# Patient Record
Sex: Male | Born: 1995 | Race: White | Hispanic: No
Health system: Southern US, Community
[De-identification: ages and names within clinical notes are randomized; demographics above are authoritative.]

## PROBLEM LIST (undated history)

## (undated) DIAGNOSIS — R319 Hematuria, unspecified: Secondary | ICD-10-CM

## (undated) DIAGNOSIS — C801 Malignant (primary) neoplasm, unspecified: Secondary | ICD-10-CM

---
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
## 2013-05-13 ENCOUNTER — Encounter: Payer: Self-pay | Admitting: Family Medicine

## 2013-05-14 ENCOUNTER — Emergency Department (HOSPITAL_COMMUNITY)
Admission: EM | Admit: 2013-05-14 | Discharge: 2013-05-14 | Disposition: A | Discharge status: Home / Self Care | Payer: Medicaid Other | Attending: Emergency Medicine | Admitting: Emergency Medicine

## 2013-05-14 ENCOUNTER — Encounter (HOSPITAL_COMMUNITY): Payer: Self-pay | Admitting: Emergency Medicine

## 2013-05-14 DIAGNOSIS — X30XXXA Exposure to excessive natural heat, initial encounter: Secondary | ICD-10-CM | POA: Insufficient documentation

## 2013-05-14 DIAGNOSIS — Y9289 Other specified places as the place of occurrence of the external cause: Secondary | ICD-10-CM | POA: Insufficient documentation

## 2013-05-14 DIAGNOSIS — T678XXA Other effects of heat and light, initial encounter: Secondary | ICD-10-CM | POA: Insufficient documentation

## 2013-05-14 DIAGNOSIS — Z79899 Other long term (current) drug therapy: Secondary | ICD-10-CM | POA: Insufficient documentation

## 2013-05-14 DIAGNOSIS — F17200 Nicotine dependence, unspecified, uncomplicated: Secondary | ICD-10-CM | POA: Insufficient documentation

## 2013-05-14 DIAGNOSIS — Y9389 Activity, other specified: Secondary | ICD-10-CM | POA: Insufficient documentation

## 2013-05-14 DIAGNOSIS — T679XXA Effect of heat and light, unspecified, initial encounter: Secondary | ICD-10-CM

## 2013-05-14 LAB — URINALYSIS, ROUTINE W REFLEX MICROSCOPIC
Bilirubin Urine: NEGATIVE
GLUCOSE, UA: NEGATIVE mg/dL
Hgb urine dipstick: NEGATIVE
KETONES UR: NEGATIVE mg/dL
LEUKOCYTES UA: NEGATIVE
NITRITE: NEGATIVE
PROTEIN: NEGATIVE mg/dL
Specific Gravity, Urine: 1.025 (ref 1.005–1.030)
UROBILINOGEN UA: 1 mg/dL (ref 0.0–1.0)
pH: 6 (ref 5.0–8.0)

## 2013-05-14 LAB — I-STAT CHEM 8, ED
BUN: 12 mg/dL (ref 6–23)
CALCIUM ION: 1.2 mmol/L (ref 1.12–1.23)
Chloride: 102 mEq/L (ref 96–112)
Creatinine, Ser: 1.1 mg/dL (ref 0.50–1.35)
Glucose, Bld: 68 mg/dL — ABNORMAL LOW (ref 70–99)
HEMATOCRIT: 46 % (ref 39.0–52.0)
HEMOGLOBIN: 15.6 g/dL (ref 13.0–17.0)
Potassium: 3.6 mEq/L — ABNORMAL LOW (ref 3.7–5.3)
SODIUM: 142 meq/L (ref 137–147)
TCO2: 27 mmol/L (ref 0–100)

## 2013-05-14 MED ORDER — POTASSIUM CHLORIDE 20 MEQ/15ML (10%) PO LIQD
40.0000 meq | Freq: Once | ORAL | Status: DC
  Filled 2013-05-14: qty 30

## 2013-05-14 MED ORDER — POTASSIUM CHLORIDE CRYS ER 20 MEQ PO TBCR
40.0000 meq | EXTENDED_RELEASE_TABLET | Freq: Once | ORAL | Status: AC
  Administered 2013-05-14: 40 meq via ORAL
  Filled 2013-05-14: qty 2

## 2013-05-14 NOTE — ED Notes (Addendum)
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

## 2013-05-14 NOTE — Discharge Instructions (Signed)
°Emergency Department Resource Guide °1) Find a Doctor and Pay Out of Pocket °Although you won't have to find out who is covered by your insurance plan, it is a good idea to ask around and get recommendations. You will then need to call the office and see if the doctor you have chosen will accept you as a new patient and what types of options they offer for patients who are self-pay. Some doctors offer discounts or will set up payment plans for their patients who do not have insurance, but you will need to ask so you aren't surprised when you get to your appointment. ° °2) Contact Your Local Health Department °Not all health departments have doctors that can see patients for sick visits, but many do, so it is worth a call to see if yours does. If you don't know where your local health department is, you can check in your phone book. The CDC also has a tool to help you locate your state's health department, and many state websites also have listings of all of their local health departments. ° °3) Find a Walk-in Clinic °If your illness is not likely to be very severe or complicated, you may want to try a walk in clinic. These are popping up all over the country in pharmacies, drugstores, and shopping centers. They're usually staffed by nurse practitioners or physician assistants that have been trained to treat common illnesses and complaints. They're usually fairly quick and inexpensive. However, if you have serious medical issues or chronic medical problems, these are probably not your best option. ° °No Primary Care Doctor: °- Call [facility] at  832-8000 - they can help you locate a primary care doctor that  accepts your insurance, provides certain services, etc. °- Physician Referral Service- 1-800-533-3463 ° °Chronic Pain Problems: °Organization         Address  Phone   Notes  °[facility]  (336) 297-2271 Patients need to be referred by their primary care doctor.  ° °Medication  Assistance: °Organization         Address  Phone   Notes  °[place] [facility] [address] °[place], [place] [address] (336) 641-8030 --Must be a resident of [place] °-- Must have NO insurance coverage whatsoever (no Medicaid/ Medicare, etc.) °-- The pt. MUST have a primary care doctor that directs their care regularly and follows them in the community °  °[facility]  (866) 331-1348   °[employer]  (888) 892-1162   ° °Agencies that provide inexpensive medical care: °Organization         Address  Phone   Notes  °[facility]  (336) 832-8035   °[facility]    (336) 832-7272   °[facility] [address] °[place], [place] [address] (336) 832-4777   °[facility] [address], °[place] (336) 271-4999   °[facility]    (336) 373-0678   °[facility]    (336) 272-1050   °[facility] ° [address], [place] Phone:  (336) 832-4444, Fax:  (336) 832-4440 Hours of Operation:  9 am - 6 pm, M-F.  Also accepts Medicaid/Medicare and self-pay.  °[facility] ° [address], [place] Phone: (336) 832-3150, Fax: (336) [phone]. Hours of Operation:  8:30 am - 5:30 pm, M-F.  Also accepts Medicaid and self-pay.  °[facility] [place] [address], [place] Phone: (336) 878-6027   °[facility] [address], [place], [place] (336)723-1848, Ext. 123 Mondays & Thursdays: 7-9 AM.  First 15 patients are seen on a first come, first serve basis. °  ° °Medicaid-accepting [place] Providers: ° °Organization         Address  Phone   Notes  °[facility] [address], [place] (336) 641-2100 Also accepts self-pay patients.  °[facility] [address], [place] ° (336) 856-9996   °[facility] [address], [place]  (336) 288-8857   °[facility] [address] [place] [address], [place] (336) 299-7000   °Veita Bland [address], [place]  ° (336) 373-1557 Only accepts [place] Access Medicaid patients after they have their name applied to their card.  ° °Self-Pay (no insurance) in [place]: ° °Organization         Address  Phone   Notes  °Sickle Cell Patients, [facility] [address], [place] (336) 832-1970   °[facility] [address], [place] (336) 832-4400   °[facility] ° [address], [place] (336) 992-4800   °[facility]/Dr. Osei-Bonsu ° [address] [place] [address], [place] or [address], [place] (336) 841-8500 Phone number for both [place] and [place] locations is the same.  °[facility] [address], [place] (336) 299-0000   °[facility] [address] [place] [address], [place] or [address] (336) 852-7530 °(336) 878-2260   °[facility] [address], [place] (336) 350-1642, phone; (336) 294-5005, fax Sees patients 1st and 3rd Saturday of every month.  Must not qualify for public or private insurance (i.e. Medicaid, Medicare, [place] Health Choice, Veterans' Benefits) • Household income should be no more than 200% of the poverty level •The clinic cannot treat you if you are pregnant or think you are pregnant • Sexually transmitted diseases are not treated at the clinic.  ° ° °Dental Care: °Organization         Address  Phone  Notes  °[place] [facility] [address], [place] (336) 641-6152 Accepts children up to age 21 who are enrolled in Medicaid or [place] Health Choice; pregnant women with a Medicaid card; and children who have applied for Medicaid or [place] Health Choice, but were declined, whose parents can pay a reduced fee at time of service.  °[place]  [facility] [place]  [address], [place] (336) 641-7733 Accepts children up to age 21 who are enrolled in Medicaid or [place] Health Choice; pregnant women with a Medicaid card; and children who have applied for Medicaid or  Health Choice, but were declined, whose parents can pay a reduced fee at time of service.  °[facility] ° [address], [place] (336) 641-4533 Patients are seen by appointment only. Walk-ins are not accepted. [facility] will see patients 18 years of age and older. °Monday - Tuesday (8am-5pm) °Most Wednesdays (8:30-5pm) °$30 per visit, cash only  °[facility] ° [address], [place] (336) 641-4533 Patients are seen by appointment only. Walk-ins are not accepted. [facility] will see patients 18 years of age and older. °One   Wednesday Evening (Monthly: Volunteer Based).  $30 per visit, cash only  °[facility]  (919) [phone] for adults; Children under age 4, call Graduate Pediatric Dentistry at ([phone]) [phone]. Children aged 4-14, please call ([phone]) [phone] to request a pediatric application. ° Dental services are provided in all areas of dental care including fillings, crowns and bridges, complete and partial dentures, implants, gum treatment, root canals, and extractions. Preventive care is also provided. Treatment is provided to both adults and children. °Patients are selected via a lottery and there is often a waiting list. °  °[facility] [address], °[place] ° (336) 763-8833 [email or web address] °  °[facility] [address], [place], [place] (336)723-1848, Ext. 123 Second and Fourth Thursday of each month, opens at 6:30 AM; Clinic ends at 9 AM.  Patients are seen on a first-come first-served basis, and a limited number are seen during each clinic.  ° °[facility] ° [address], [place], [place] (336) 723-7904    Eligibility Requirements °You must have lived in [place], [place], or [place] counties for at least the last three months. °  You cannot be eligible for state or federal sponsored healthcare insurance, including Veterans Administration, Medicaid, or Medicare. °  You generally cannot be eligible for healthcare insurance through your employer.  °  How to apply: °Eligibility screenings are held every Tuesday and Wednesday afternoon from 1:00 pm until 4:00 pm. You do not need an appointment for the interview!  °[facility] [address], [place], [place] 336-631-2330   °[facility]  336-342-8273   °[facility]  336-703-3100   °[facility]  336-570-6415   ° °Behavioral Health Resources in the Community: °Intensive Outpatient Programs °Organization         Address  Phone  Notes  °[facility] [address], [place], [place] 336-878-6098   °[facility] [address], [place], [place] 336-832-9800   °[facility] [address], [place], [place] ° 336-882-2125   °[facility] [address],  °[place], [place] 1-800-853-5163 or 336-641-4981   °Substance Abuse Resources °Organization         Address  Phone  Notes  °[facility]  336-882-2125   °[facility]  336-784-9470   °[facility]  336-285-9073   °[facility]  336-845-3988   °Residential & Outpatient Substance Abuse Program  1-800-659-3381   °Psychological Services °Organization         Address  Phone  Notes  °[facility]  336- 832-9600   °[facility]  336- 378-7881   °[facility] [address], [place] 1-800-853-5163 or 336-641-4981   ° °Mobile Crisis Teams °Organization         Address  Phone  Notes  °[facility], Mobile Crisis Care Unit  1-877-626-1772   °Assertive °[facility] ° [address]  [place], [place] 336-834-9664   °Sharon DeEsch [address] °[place] 336-554-5454   ° °Self-Help/Support Groups °Organization         Address  Phone             Notes  °[facility]  - variety of support groups  336- 373-1402 Call for more information  °Narcotics Anonymous (NA), [facility] [address], °[place]  2 meetings at this location  ° °  Residential Treatment Programs Organization         Address  Phone  Notes  [facility] [address],    [place]  1-908-775-1287   [facility]  [address] [place] [address], [place], [place]   [place], [place] [phone] Admissions: 8am-3pm M-F  [facility] [place] [address],    [place], [place] [phone]   [facility] [address] [place], [place], [place]   [facility] [address],  [place], [place]   [facility] Intensive Outpatient [address], [place], [place]   [facility] ([place] [address]  [place], [place] 1-(203)603-1468 or 929-552-3387   [facility] [address] [place], [place] Accepts Medicaid  [facility] [address]  [place] 1-(917)487-2581 Substance Abuse/Addiction Treatment   [facility]         Address  Phone  Notes  [facility]  763-131-1028   Domenic Schwab, PhD [address] Arlis Porta [place], [place]   (815) 207-0894 or 276-864-3554   [facility] [place], [place] [phone]   [facility] [address] [place], [place] (469)633-0539 Insurance/Medicaid/sponsorship through [facility] [address] [place]                                    [place], [place] [phone] [place] [address], [place] [phone]    Dr. Adele Schilder  ([phone]   [facility] Dept. 1) [address], [place] 2) [place] 3)  [address], [place] [phone]  [phone]   [place] (716)[phone] or [phone] (After Hours)      Increase your fluid intake (ie:  Gatorade) for the next few days, as discussed. Keep yourself hydrated when you work outside in the heat with Gatorade (not caffeinated products).  Return to the Emergency Department immediately if not improving (or even worsening) despite taking the medicines as prescribed, any black or bloody stool or vomit, if you develop a fever over "101," or for any other concerns.

## 2013-05-14 NOTE — ED Provider Notes (Signed)
CSN: [id number]     Arrival date & time 05/14/13  5638 History   First MD Initiated Contact with Patient 05/14/13 [phone]     Chief Complaint  Patient presents with  . Dizziness      HPI Pt was seen at 1010. Per pt, c/o gradual onset and resolution of one episode of feeling "lightheaded" that occurred yesterday after working outside all day in the hot sun. Lightheadedness was associated with an episode of N/V. Pt states he "was just drinking [employer]" yesterday while working outside. States after he got home last night he has been drinking Gatorade with improvement in his symptoms today. Pt states he came to the ED "just to get checked out" "because I called into work today." Has tol PO well without N/V. Denies CP/palpitations, no SOB/cough, no abd pain, no diarrhea, no back pain, no fevers, no rash, no focal motor weakness, no tingling/numbness in extremities, no syncope, no black or blood in stools or emesis.    History reviewed. No pertinent past medical history.  Past Surgical History  Procedure Laterality Date  . Appendectomy  2006    History  Substance Use Topics  . Smoking status: Current Every Day Smoker  . Smokeless tobacco: Not on file  . Alcohol Use: No    Review of Systems ROS: Statement: All systems negative except as marked or noted in the HPI; Constitutional: Negative for fever and chills. ; ; Eyes: Negative for eye pain, redness and discharge. ; ; ENMT: Negative for ear pain, hoarseness, nasal congestion, sinus pressure and sore throat. ; ; Cardiovascular: Negative for chest pain, palpitations, diaphoresis, dyspnea and peripheral edema. ; ; Respiratory: Negative for cough, wheezing and stridor. ; ; Gastrointestinal: +N/V. Negative for diarrhea, abdominal pain, blood in stool, hematemesis, jaundice and rectal bleeding. . ; ; Genitourinary: Negative for dysuria, flank pain and hematuria. ; ; Musculoskeletal: Negative for back pain and neck pain. Negative for swelling and  trauma.; ; Skin: Negative for pruritus, rash, abrasions, blisters, bruising and skin lesion.; ; Neuro: +lightheadedness. Negative for headache and neck stiffness. Negative for weakness, altered level of consciousness , altered mental status, extremity weakness, paresthesias, involuntary movement, seizure and syncope.       Allergies  Review of patient's allergies indicates not on file.  Home Medications   Prior to Admission medications   Medication Sig Start Date End Date Taking? Authorizing Provider  phenazopyridine (PYRIDIUM) 200 MG tablet Take 1 tablet (200 mg total) by mouth 3 (three) times daily as needed for pain. 07/18/12   Evalee Jefferson, PA-C   BP 116/55  Pulse 68  Temp(Src) 98.2 F (36.8 C) (Oral)  Resp 18  Ht 5\' 7"  (1.702 m)  Wt 135 lb (61.236 kg)  BMI 21.14 kg/m2  SpO2 100% Physical Exam 1015: Physical examination:  Nursing notes reviewed; Vital signs and O2 SAT reviewed;  Constitutional: Well developed, Well nourished, Well hydrated, In no acute distress; Head:  Normocephalic, atraumatic; Eyes: EOMI, PERRL, No scleral icterus; ENMT: TM's clear bilat. +edemetous nasal turbinates bilat with clear rhinorrhea. Mouth and pharynx normal, Mucous membranes moist; Neck: Supple, Full range of motion, No lymphadenopathy; Cardiovascular: Regular rate and rhythm, No murmur, rub, or gallop; Respiratory: Breath sounds clear & equal bilaterally, No rales, rhonchi, wheezes.  Speaking full sentences with ease, Normal respiratory effort/excursion; Chest: Nontender, Movement normal; Abdomen: Soft, Nontender, Nondistended, Normal bowel sounds; Genitourinary: No CVA tenderness; Extremities: Pulses normal, No tenderness, No edema, No calf edema or asymmetry.; Neuro: AA&Ox3, Major CN  grossly intact.  Speech clear. No gross focal motor or sensory deficits in extremities. Climbs on and off stretcher easily by himself. Gait steady.; Skin: Color normal, Warm, Dry.   ED Course  Procedures     EKG  Interpretation None      MDM  MDM Reviewed: previous chart, nursing note and vitals Reviewed previous: labs Interpretation: labs    Results for orders placed during the hospital encounter of 05/14/13  URINALYSIS, ROUTINE W REFLEX MICROSCOPIC      Result Value Ref Range   Color, Urine YELLOW  YELLOW   APPearance CLEAR  CLEAR   Specific Gravity, Urine 1.025  1.005 - 1.030   pH 6.0  5.0 - 8.0   Glucose, UA NEGATIVE  NEGATIVE mg/dL   Hgb urine dipstick NEGATIVE  NEGATIVE   Bilirubin Urine NEGATIVE  NEGATIVE   Ketones, ur NEGATIVE  NEGATIVE mg/dL   Protein, ur NEGATIVE  NEGATIVE mg/dL   Urobilinogen, UA 1.0  0.0 - 1.0 mg/dL   Nitrite NEGATIVE  NEGATIVE   Leukocytes, UA NEGATIVE  NEGATIVE  I-STAT CHEM 8, ED      Result Value Ref Range   Sodium 142  137 - 147 mEq/L   Potassium 3.6 (*) 3.7 - 5.3 mEq/L   Chloride 102  96 - 112 mEq/L   BUN 12  6 - 23 mg/dL   Creatinine, Ser 1.10  0.50 - 1.35 mg/dL   Glucose, Bld 68 (*) 70 - 99 mg/dL   Calcium, Ion 1.20  1.12 - 1.23 mmol/L   TCO2 27  0 - 100 mmol/L   Hemoglobin 15.6  13.0 - 17.0 g/dL   HCT 46.0  39.0 - 52.0 %    1200:  Workup reassuring. Not orthostatic on VS. Potasium repleted PO. Pt has tol PO well without N/V while in the ED. States he continues to feel "better" and wants to go home now. Dx and testing d/w pt and family.  Questions answered.  Verb understanding, agreeable to d/c home with outpt f/u.     Alfonzo Feller, DO 05/16/13 1557

## 2013-05-15 LAB — URINE CULTURE
Colony Count: NO GROWTH
Culture: NO GROWTH

## 2015-02-11 ENCOUNTER — Emergency Department (HOSPITAL_COMMUNITY): Payer: Self-pay

## 2015-02-11 ENCOUNTER — Encounter (HOSPITAL_COMMUNITY): Payer: Self-pay | Admitting: Emergency Medicine

## 2015-02-11 ENCOUNTER — Emergency Department (HOSPITAL_COMMUNITY)
Admission: EM | Admit: 2015-02-11 | Discharge: 2015-02-11 | Disposition: A | Discharge status: Home / Self Care | Payer: Self-pay | Attending: Emergency Medicine | Admitting: Emergency Medicine

## 2015-02-11 DIAGNOSIS — R10A0 Flank pain, unspecified side: Secondary | ICD-10-CM

## 2015-02-11 DIAGNOSIS — R319 Hematuria, unspecified: Secondary | ICD-10-CM

## 2015-02-11 DIAGNOSIS — F17200 Nicotine dependence, unspecified, uncomplicated: Secondary | ICD-10-CM | POA: Insufficient documentation

## 2015-02-11 DIAGNOSIS — R109 Unspecified abdominal pain: Secondary | ICD-10-CM | POA: Insufficient documentation

## 2015-02-11 HISTORY — DX: Hematuria, unspecified: R31.9

## 2015-02-11 LAB — URINALYSIS, ROUTINE W REFLEX MICROSCOPIC
Glucose, UA: NEGATIVE mg/dL
LEUKOCYTES UA: NEGATIVE
NITRITE: NEGATIVE
PH: 6 (ref 5.0–8.0)
PROTEIN: 100 mg/dL — AB
Specific Gravity, Urine: 1.025 (ref 1.005–1.030)

## 2015-02-11 LAB — URINE MICROSCOPIC-ADD ON: SQUAMOUS EPITHELIAL / LPF: NONE SEEN

## 2015-02-11 LAB — BASIC METABOLIC PANEL
Anion gap: 8 (ref 5–15)
BUN: 16 mg/dL (ref 6–20)
CALCIUM: 9.9 mg/dL (ref 8.9–10.3)
CO2: 27 mmol/L (ref 22–32)
CREATININE: 1.21 mg/dL (ref 0.61–1.24)
Chloride: 106 mmol/L (ref 101–111)
GFR calc Af Amer: >60 mL/min (ref >60)
GLUCOSE: 91 mg/dL (ref 65–99)
POTASSIUM: 4.4 mmol/L (ref 3.5–5.1)
SODIUM: 141 mmol/L (ref 135–145)

## 2015-02-11 LAB — CBC
HCT: 46.4 % (ref 39.0–52.0)
Hemoglobin: 16.7 g/dL (ref 13.0–17.0)
MCH: 32.5 pg (ref 26.0–34.0)
MCHC: 36 g/dL (ref 30.0–36.0)
MCV: 90.3 fL (ref 78.0–100.0)
PLATELETS: 220 10*3/uL (ref 150–400)
RBC: 5.14 MIL/uL (ref 4.22–5.81)
RDW: 12.6 % (ref 11.5–15.5)
WBC: 7.5 10*3/uL (ref 4.0–10.5)

## 2015-02-11 NOTE — ED Notes (Signed)
MD at bedside. 

## 2015-02-11 NOTE — ED Provider Notes (Signed)
CSN: [id number]     Arrival date & time 02/11/15  1310 History   First MD Initiated Contact with Patient 02/11/15 1521     Chief Complaint  Patient presents with  . Hematuria      HPI Pt was seen at 1535. Per pt, c/o gradual onset and persistence of multiple intermittent episodes of hematuria for the past 3 years. Pt states he was evaluated in the ED several years ago for same, but never f/u with Uro MD. Pt states he has continued to have intermittent hematuria, as well as left flank "pain" since then. States he is "worried I might have prostate cancer," so he came to the ED for re-evaluation. Denies any change in his symptoms over the past 3 years. Denies dysuria, no testicular pain/swelling, no abd pain, no N/V/D, no fevers, no rash.    Past Medical History  Diagnosis Date  . Hematuria 2014   Past Surgical History  Procedure Laterality Date  . Appendectomy  2006    Social History  Substance Use Topics  . Smoking status: Current Every Day Smoker  . Smokeless tobacco: None  . Alcohol Use: No    Review of Systems ROS: Statement: All systems negative except as marked or noted in the HPI; Constitutional: Negative for fever and chills. ; ; Eyes: Negative for eye pain, redness and discharge. ; ; ENMT: Negative for ear pain, hoarseness, nasal congestion, sinus pressure and sore throat. ; ; Cardiovascular: Negative for chest pain, palpitations, diaphoresis, dyspnea and peripheral edema. ; ; Respiratory: Negative for cough, wheezing and stridor. ; ; Gastrointestinal: Negative for nausea, vomiting, diarrhea, abdominal pain, blood in stool, hematemesis, jaundice and rectal bleeding. . ; ; Genitourinary: Negative for dysuria. +flank pain and hematuria. ; ; Genital:  No penile drainage or rash, no testicular pain or swelling, no scrotal rash or swelling. ;; Musculoskeletal: Negative for back pain and neck pain. Negative for swelling and trauma.; ; Skin: Negative for pruritus, rash, abrasions,  blisters, bruising and skin lesion.; ; Neuro: Negative for headache, lightheadedness and neck stiffness. Negative for weakness, altered level of consciousness , altered mental status, extremity weakness, paresthesias, involuntary movement, seizure and syncope.      Allergies  Review of patient's allergies indicates no known allergies.  Home Medications   Prior to Admission medications   Not on File   BP 125/78 mmHg  Pulse 77  Temp(Src) 98.4 F (36.9 C) (Oral)  Resp 16  Ht 5\' 7"  (1.702 m)  Wt 130 lb (58.968 kg)  BMI 20.36 kg/m2  SpO2 91% Physical Exam  1540: Physical examination:  Nursing notes reviewed; Vital signs and O2 SAT reviewed;  Constitutional: Well developed, Well nourished, Well hydrated, In no acute distress; Head:  Normocephalic, atraumatic; Eyes: EOMI, PERRL, No scleral icterus; ENMT: Mouth and pharynx normal, Mucous membranes moist; Neck: Supple, Full range of motion, No lymphadenopathy; Cardiovascular: Regular rate and rhythm, No murmur, rub, or gallop; Respiratory: Breath sounds clear & equal bilaterally, No rales, rhonchi, wheezes.  Speaking full sentences with ease, Normal respiratory effort/excursion; Chest: Nontender, Movement normal; Abdomen: Soft, Nontender, Nondistended, Normal bowel sounds; Genitourinary: No CVA tenderness; Extremities: Pulses normal, No tenderness, No edema, No calf edema or asymmetry.; Neuro: AA&Ox3, Major CN grossly intact.  Speech clear. No gross focal motor or sensory deficits in extremities.; Skin: Color normal, Warm, Dry.   ED Course  Procedures (including critical care time) Labs Review  Imaging Review  I have personally reviewed and evaluated these images and lab results  as part of my medical decision-making.   EKG Interpretation None      MDM  MDM Reviewed: previous chart, nursing note and vitals Reviewed previous: CT scan Interpretation: labs and CT scan      Results for orders placed or performed during the hospital  encounter of 02/11/15  Urinalysis, Routine w reflex microscopic (not at [facility])  Result Value Ref Range   Color, Urine AMBER (A) YELLOW   APPearance CLOUDY (A) CLEAR   Specific Gravity, Urine 1.025 1.005 - 1.030   pH 6.0 5.0 - 8.0   Glucose, UA NEGATIVE NEGATIVE mg/dL   Hgb urine dipstick LARGE (A) NEGATIVE   Bilirubin Urine SMALL (A) NEGATIVE   Ketones, ur TRACE (A) NEGATIVE mg/dL   Protein, ur 100 (A) NEGATIVE mg/dL   Nitrite NEGATIVE NEGATIVE   Leukocytes, UA NEGATIVE NEGATIVE  Urine microscopic-add on  Result Value Ref Range   Squamous Epithelial / LPF NONE SEEN NONE SEEN   WBC, UA 0-5 0 - 5 WBC/hpf   RBC / HPF TOO NUMEROUS TO COUNT 0 - 5 RBC/hpf   Bacteria, UA RARE (A) NONE SEEN  CBC  Result Value Ref Range   WBC 7.5 4.0 - 10.5 K/uL   RBC 5.14 4.22 - 5.81 MIL/uL   Hemoglobin 16.7 13.0 - 17.0 g/dL   HCT 46.4 39.0 - 52.0 %   MCV 90.3 78.0 - 100.0 fL   MCH 32.5 26.0 - 34.0 pg   MCHC 36.0 30.0 - 36.0 g/dL   RDW 12.6 11.5 - 15.5 %   Platelets 220 150 - 400 K/uL  Basic metabolic panel  Result Value Ref Range   Sodium 141 135 - 145 mmol/L   Potassium 4.4 3.5 - 5.1 mmol/L   Chloride 106 101 - 111 mmol/L   CO2 27 22 - 32 mmol/L   Glucose, Bld 91 65 - 99 mg/dL   BUN 16 6 - 20 mg/dL   Creatinine, Ser 1.21 0.61 - 1.24 mg/dL   Calcium 9.9 8.9 - 10.3 mg/dL   GFR calc non Af Amer >60 >60 mL/min   GFR calc Af Amer >60 >60 mL/min   Anion gap 8 5 - 15   Ct Renal Stone Study 02/11/2015  CLINICAL DATA:  Flank pain with hematuria left-sided pain EXAM: CT ABDOMEN AND PELVIS WITHOUT CONTRAST TECHNIQUE: Multidetector CT imaging of the abdomen and pelvis was performed following the standard protocol without IV contrast. COMPARISON:  CT 07/18/2012 FINDINGS: Lower chest:  Lung bases clear without infiltrate or effusion. Hepatobiliary: Liver normal in size and contour without focal lesion. Gallbladder and bile ducts normal. Pancreas: Negative Spleen: Negative Adrenals/Urinary Tract: No renal  obstruction or mass. No urinary tract calculi. Vague density in the right bladder may represent blood clot versus tumor. This measures approximately 2.5 cm in diameter. Stomach/Bowel: Negative for bowel obstruction. No bowel edema. Appendectomy clips right lower quadrant. Vascular/Lymphatic: Normal aorta and IVC.  No lymphadenopathy. Reproductive: Normal prostate Other: No free fluid in the pelvis or abdomen. Musculoskeletal: No fracture or bone lesion identified. IMPRESSION: Negative for urinary tract calculi.  No renal obstruction Vague density in the right bladder may represent blood clot versus tumor. The patient is currently give history of hematuria. Electronically Signed   By: Franchot Gallo M.D.   On: 02/11/2015 16:59    1725:  T/C to Uro Dr. Matilde Sprang, case discussed, including:  HPI, pertinent PM/SHx, VS/PE, dx testing, ED course and treatment:  Agrees with ED workup, requests to encourage pt to  f/u in ofc this week, will need cystoscope to further evaluate CT findings. Dx and Youlanda Roys well as d/w Uro MD, d/w pt.  Questions answered.  Verb understanding, agreeable to d/c home with outpt f/u.   Francine Graven, DO 02/15/15 1712

## 2015-02-11 NOTE — Discharge Instructions (Signed)
°Emergency Department Resource Guide °1) Find a Doctor and Pay Out of Pocket °Although you won't have to find out who is covered by your insurance plan, it is a good idea to ask around and get recommendations. You will then need to call the office and see if the doctor you have chosen will accept you as a new patient and what types of options they offer for patients who are self-pay. Some doctors offer discounts or will set up payment plans for their patients who do not have insurance, but you will need to ask so you aren't surprised when you get to your appointment. ° °2) Contact Your Local Health Department °Not all health departments have doctors that can see patients for sick visits, but many do, so it is worth a call to see if yours does. If you don't know where your local health department is, you can check in your phone book. The CDC also has a tool to help you locate your state's health department, and many state websites also have listings of all of their local health departments. ° °3) Find a Walk-in Clinic °If your illness is not likely to be very severe or complicated, you may want to try a walk in clinic. These are popping up all over the country in pharmacies, drugstores, and shopping centers. They're usually staffed by nurse practitioners or physician assistants that have been trained to treat common illnesses and complaints. They're usually fairly quick and inexpensive. However, if you have serious medical issues or chronic medical problems, these are probably not your best option. ° °No Primary Care Doctor: °- Call [facility] Connect at  832-8000 - they can help you locate a primary care doctor that  accepts your insurance, provides certain services, etc. °- Physician Referral Service- 1-800-533-3463 ° °Chronic Pain Problems: °Organization         Address  Phone   Notes  °[facility]  ([phone]) [phone] Patients need to be referred by their primary care doctor.  ° °Medication  Assistance: °Organization         Address  Phone   Notes  °[place] [facility] [address] °[place], [place] [address] (336) 641-8030 --Must be a resident of [place] °-- Must have NO insurance coverage whatsoever (no Medicaid/ Medicare, etc.) °-- The pt. MUST have a primary care doctor that directs their care regularly and follows them in the community °  °[facility]  (866) 331-1348   °[employer]  (888) 892-1162   ° °Agencies that provide inexpensive medical care: °Organization         Address  Phone   Notes  °[facility]  (336) 832-8035   °[facility]    (336) 832-7272   °[facility] [address] °[place], [place] [address] (336) 832-4777   °[facility] [address], °[place] (336) 271-4999   °[facility]    (336) 373-0678   °[facility]    (336) 272-1050   °[facility] ° [address], [place] Phone:  (336) 832-4444, Fax:  (336) 832-4440 Hours of Operation:  9 am - 6 pm, M-F.  Also accepts Medicaid/Medicare and self-pay.  °[facility] ° [address],  Phone: (336) [phone], Fax: (336) [phone]. Hours of Operation:  8:30 am - 5:30 pm, M-F.  Also accepts Medicaid and self-pay.  °[facility] [place] [address], [place] Phone: (336) 878-6027   °[facility] [address], [place], [place] (336)723-1848, Ext. 123 Mondays & Thursdays: 7-9 AM.  First 15 patients are seen on a first come, first serve basis. °  ° °Medicaid-accepting [place] Providers: ° °Organization         Address  Phone   Notes  °[facility] [address], [place] (336) 641-2100 Also accepts self-pay patients.  °[facility] [address], [place] ° (336) 856-9996   °[facility] [address], [place]  (336) 288-8857   °[facility] [address] [place] [address], [place] (336) 299-7000   °Veita Bland [address], [place]  ° (336) [phone] Only accepts [place] Access Medicaid patients after they have their name applied to their card.  ° °Self-Pay (no insurance) in [place]: ° °Organization         Address  Phone   Notes  °Sickle Cell Patients, [facility] [address], [place] (336) 832-1970   °[facility] [address], [place] (336) 832-4400   °[facility] ° [address], [place] (336) 992-4800   °[facility]/Dr. Osei-Bonsu ° [address] [place] [address], [place] or [address], [place] (336) [phone] Phone number for both [place] and [place] locations is the same.  °[facility] [address], [place] (336) 299-0000   °[facility] [address] [place] [address], [place] or [address] (336) 852-7530 °(336) 878-2260   °[facility] [address], [place] (336) 350-1642, phone; (336) 294-5005, fax Sees patients 1st and 3rd Saturday of every month.  Must not qualify for public or private insurance (i.e. Medicaid, Medicare, [place] Health Choice, Veterans' Benefits) • Household income should be no more than 200% of the poverty level •The clinic cannot treat you if you are pregnant or think you are pregnant • Sexually transmitted diseases are not treated at the clinic.  ° ° °Dental Care: °Organization         Address  Phone  Notes  °[place] [facility] [address], [place] (336) 641-6152 Accepts children up to age 21 who are enrolled in Medicaid or [place] Health Choice; pregnant women with a Medicaid card; and children who have applied for Medicaid or [place] Health Choice, but were declined, whose parents can pay a reduced fee at time of service.  °[place]  [facility] [place]  [address], [place] (336) 641-7733 Accepts children up to age 21 who are enrolled in Medicaid or [place] Health Choice; pregnant women with a Medicaid card; and children who have applied for Medicaid or  Health Choice, but were declined, whose parents can pay a reduced fee at time of service.  °[facility] ° [address], [place] (336) 641-4533 Patients are seen by appointment only. Walk-ins are not accepted. [facility] will see patients 18 years of age and older. °Monday - Tuesday (8am-5pm) °Most Wednesdays (8:30-5pm) °$30 per visit, cash only  °[facility] ° [address], [place] (336) 641-4533 Patients are seen by appointment only. Walk-ins are not accepted. [facility] will see patients 18 years of age and older. °One   Wednesday Evening (Monthly: Volunteer Based).  $30 per visit, cash only  °[facility]  (919) 537-3737 for adults; Children under age 4, call Graduate Pediatric Dentistry at ([phone]) [phone]. Children aged 4-14, please call ([phone]) [phone] to request a pediatric application. ° Dental services are provided in all areas of dental care including fillings, crowns and bridges, complete and partial dentures, implants, gum treatment, root canals, and extractions. Preventive care is also provided. Treatment is provided to both adults and children. °Patients are selected via a lottery and there is often a waiting list. °  °[facility] [address], °[place] ° (336) 763-8833 [email or web address] °  °[facility] [address], [place], [place] (336)723-1848, Ext. 123 Second and Fourth Thursday of each month, opens at 6:30 AM; Clinic ends at 9 AM.  Patients are seen on a first-come first-served basis, and a limited number are seen during each clinic.  ° °[facility] ° [address], [place], [place] (336) [phone]    Eligibility Requirements °You must have lived in [place], [place], or [place] counties for at least the last three months. °  You cannot be eligible for state or federal sponsored healthcare insurance, including Veterans Administration, Medicaid, or Medicare. °  You generally cannot be eligible for healthcare insurance through your employer.  °  How to apply: °Eligibility screenings are held every Tuesday and Wednesday afternoon from 1:00 pm until 4:00 pm. You do not need an appointment for the interview!  °[facility] [address], [place], [place] 336-631-2330   °[facility]  336-342-8273   °[facility]  336-703-3100   °[facility]  336-570-6415   ° °Behavioral Health Resources in the Community: °Intensive Outpatient Programs °Organization         Address  Phone  Notes  °[facility] [address], [place], [place] 336-878-6098   °[facility] [address], [place], [place] 336-832-9800   °[facility] [address], [place], [place] ° 336-882-2125   °[facility] [address],  °[place], [place] 1-800-853-5163 or 336-641-4981   °Substance Abuse Resources °Organization         Address  Phone  Notes  °[facility]  336-882-2125   °[facility]  336-784-9470   °[facility]  336-285-9073   °[facility]  336-845-3988   °Residential & Outpatient Substance Abuse Program  1-800-659-3381   °Psychological Services °Organization         Address  Phone  Notes  °[facility]  336- 832-9600   °[facility]  336- 378-7881   °[facility] [address], [place] 1-800-853-5163 or 336-641-4981   ° °Mobile Crisis Teams °Organization         Address  Phone  Notes  °[facility], Mobile Crisis Care Unit  1-877-626-1772   °Assertive °[facility] ° [address]  [place], [place] 336-834-9664   °Sharon DeEsch [address] °[place] 336-554-5454   ° °Self-Help/Support Groups °Organization         Address  Phone             Notes  °[facility]  - variety of support groups  336- 373-1402 Call for more information  °Narcotics Anonymous (NA), [facility] [address], °[place]   2 meetings at this location  ° °  Residential Treatment Programs Organization         Address  Phone  Notes  [facility] [address]    [place]  1-(940)599-1231   [facility]  [address] [place] [id number], [place], [place]   [place], [place] [phone] Admissions: 8am-3pm M-F  [facility]stance [place] [address]    [place], [place] [phone]   [facility] [address] [place], [place], [place]   [facility] [address]  [place], [place]   [facility] Intensive Outpatient [address], [place], [place]   [facility] ([place] [place]  [place], [place] 1-6401210716 or 719-011-6308   [facility] [address], [place], [place] Accepts Medicaid  [facility] [place] [address],  [place] 1-570-089-9348 Substance Abuse/Addiction Treatment   [facility] Organization         Address  Phone  Notes  [facility]  (619)382-4676   Domenic Schwab, PhD [address] [place], [place]   867-335-2981 or [phone]   [place], [place] [phone]   [facility] [address] [place], [place] (512)295-4739 Insurance/Medicaid/sponsorship through [facility] [address] [place]                                    [place], [place] 231-715-1551 [place] [address] [place], [place] (831)[phone]    Dr. Adele Schilder  [phone]   [facility] Dept. 1) [address],  2) [place] 3)  [address], [place] [phone]  [phone]   [place] 475-595-5589 or [phone] (After Hours)      Your CT scan shows you may have a "blood clot" in your bladder. Call the Urologist on Monday to schedule a follow up appointment this week to further evaluate this finding.  Return to the Emergency Department immediately sooner if worsening.

## 2015-02-11 NOTE — ED Notes (Signed)
Having urinary problems for 2 - 3 years.  Having urinary frequency and blood in urine bright red.  Rates pain 6/10.

## 2015-02-11 NOTE — ED Notes (Signed)
Pt denies any n/v/d.

## 2015-02-13 LAB — URINE CULTURE

## 2015-02-21 ENCOUNTER — Ambulatory Visit (INDEPENDENT_AMBULATORY_CARE_PROVIDER_SITE_OTHER): Payer: Self-pay | Admitting: Urology

## 2015-02-21 DIAGNOSIS — C674 Malignant neoplasm of posterior wall of bladder: Secondary | ICD-10-CM

## 2015-02-22 ENCOUNTER — Telehealth: Payer: Self-pay | Admitting: Urology

## 2015-02-22 NOTE — Telephone Encounter (Signed)
Pt advised of pre op date/time and sx date. Spoke with Mardene Celeste Royann Shivers) Sx: 03/07/15 @ 12:45-1:45 @ [facility] with Dr. Diona Fanti with [facility]. Pre op: 03/03/15 @ 1:45am.  Self Pay.

## 2015-02-23 ENCOUNTER — Other Ambulatory Visit: Payer: Self-pay

## 2015-02-23 DIAGNOSIS — D494 Neoplasm of unspecified behavior of bladder: Secondary | ICD-10-CM

## 2015-03-02 NOTE — Patient Instructions (Signed)
David Stout  03/02/2015     @PREFPERIOPPHARMACY @   Your procedure is scheduled on 03/07/15  Report to [name] at 67 A.M.  Call this number if you have problems the morning of surgery:  [phone]   Remember:  Do not eat food or drink liquids after midnight.  Take these medicines the morning of surgery with A SIP OF WATER no meds   Do not wear jewelry, make-up or nail polish.  Do not wear lotions, powders, or perfumes.  You may wear deodorant.  Do not shave 48 hours prior to surgery.  Men may shave face and neck.  Do not bring valuables to the hospital.  [name] is not responsible for any belongings or valuables.  Contacts, dentures or bridgework may not be worn into surgery.  Leave your suitcase in the car.  After surgery it may be brought to your room.  For patients admitted to the hospital, discharge time will be determined by your treatment team.  Patients discharged the day of surgery will not be allowed to drive home.   Name and phone number of your driver:   family Special instructions:    Please read over the following fact sheets that you were given. Surgical Site Infection Prevention, Anesthesia Post-op Instructions and Care and Recovery After Surgery      PATIENT INSTRUCTIONS POST-ANESTHESIA  IMMEDIATELY FOLLOWING SURGERY:  Do not drive or operate machinery for the first twenty four hours after surgery.  Do not make any important decisions for twenty four hours after surgery or while taking narcotic pain medications or sedatives.  If you develop intractable nausea and vomiting or a severe headache please notify your doctor immediately.  FOLLOW-UP:  Please make an appointment with your surgeon as instructed. You do not need to follow up with anesthesia unless specifically instructed to do so.  WOUND CARE INSTRUCTIONS (if applicable):  Keep a dry clean dressing on the anesthesia/puncture wound site if there is drainage.  Once the wound has quit  draining you may leave it open to air.  Generally you should leave the bandage intact for twenty four hours unless there is drainage.  If the epidural site drains for more than 36-48 hours please call the anesthesia department.  QUESTIONS?:  Please feel free to call your physician or the hospital operator if you have any questions, and they will be happy to assist you.      Cystoscopy Cystoscopy is a procedure that is used to help your caregiver diagnose and sometimes treat conditions that affect your lower urinary tract. Your lower urinary tract includes your bladder and the tube through which urine passes from your bladder out of your body (urethra). Cystoscopy is performed with a thin, tube-shaped instrument (cystoscope). The cystoscope has lenses and a light at the end so that your caregiver can see inside your bladder. The cystoscope is inserted at the entrance of your urethra. Your caregiver guides it through your urethra and into your bladder. There are two main types of cystoscopy:  Flexible cystoscopy (with a flexible cystoscope).  Rigid cystoscopy (with a rigid cystoscope). Cystoscopy may be recommended for many conditions, including:  Urinary tract infections.  Blood in your urine (hematuria).  Loss of bladder control (urinary incontinence) or overactive bladder.  Unusual cells found in a urine sample.  Urinary blockage.  Painful urination. Cystoscopy may also be done to remove a sample of your tissue to be checked under a microscope (biopsy). It may also be done  to remove or destroy bladder stones. LET YOUR CAREGIVER KNOW ABOUT:  Allergies to food or medicine.  Medicines taken, including vitamins, herbs, eyedrops, over-the-counter medicines, and creams.  Use of steroids (by mouth or creams).  Previous problems with anesthetics or numbing medicines.  History of bleeding problems or blood clots.  Previous surgery.  Other health problems, including diabetes and kidney  problems.  Possibility of pregnancy, if this applies. PROCEDURE The area around the opening to your urethra will be cleaned. A medicine to numb your urethra (local anesthetic) is used. If a tissue sample or stone is removed during the procedure, you may be given a medicine to make you sleep (general anesthetic). Your caregiver will gently insert the tip of the cystoscope into your urethra. The cystoscope will be slowly glided through your urethra and into your bladder. Sterile fluid will flow through the cystoscope and into your bladder. The fluid will expand and stretch your bladder. This gives your caregiver a better view of your bladder walls. The procedure lasts about 15-20 minutes. AFTER THE PROCEDURE If a local anesthetic is used, you will be allowed to go home as soon as you are ready. If a general anesthetic is used, you will be taken to a recovery area until you are stable. You may have temporary bleeding and burning on urination.   This information is not intended to replace advice given to you by your health care provider. Make sure you discuss any questions you have with your health care provider.   Document Released: 12/22/1999 Document Revised: 01/14/2014 Document Reviewed: 06/17/2011 Elsevier Interactive Patient Education 2016 [employer] Transurethral Resection, Bladder Tumor A cancerous growth (tumor) can develop on the inside wall of the bladder. The bladder is the organ that holds urine. One way to remove the tumor is a procedure called a transurethral resection. The tumor is removed (resected) through the tube that carries urine from the bladder out of the body (urethra). No cuts (incisions) are made in the skin. Instead, the procedure is done through a thin telescope, called a resectoscope. Attached to it is a light and usually a tiny camera. The resectoscope is put into the urethra. In men, the urethra opens at the end of the penis. In women, it opens just above the vagina.  A  transurethral resection is usually used to remove tumors that have not gotten too big or too deep. These are called Stage 0, Stage 1 or Stage 2 bladder cancers. LET YOUR CAREGIVER KNOW ABOUT:  On the day of the procedure, your caregivers will need to know the last time you had anything to eat or drink. This includes water, gum, and candy. In advance, make sure they know about:   Any allergies.  All medications you are taking, including:  Herbs, eyedrops, over-the-counter medications and creams.  Blood thinners (anticoagulants), aspirin or other drugs that could affect blood clotting.  Use of steroids (by mouth or as creams).  Previous problems with anesthetics, including local anesthetics.  Possibility of pregnancy, if this applies.  Any history of blood clots.  Any history of bleeding or other blood problems.  Previous surgery.  Smoking history.  Any recent symptoms of colds or infections.  Other health problems. RISKS AND COMPLICATIONS This is usually a safe procedure. Every procedure has risks, though. For a transurethral resection, they include:  Infection. Antibiotic medication would need to be taken.  Bleeding.  Light bleeding may last for several days after the procedure.  If bleeding continues or is  heavy, the bladder may need rinsing. Or, a new catheter might be put in for awhile.  Sometimes bed rest is needed.  Urination problems.  Pain and burning can occur when urinating. This usually goes away in a few days.  Scarring from the procedure can block the flow of urine.  Bladder damage.  It can be punctured or torn during removal of the tumor. If this happens, a catheter might be needed for longer. Antibiotics would be taken while the bladder heals.  Urine can leak through the hole or tear into the abdomen. If this happens, surgery may be needed to repair the bladder. BEFORE THE PROCEDURE   A medical evaluation will be done. This may include:  A  physical examination.  Urine test. This is to make sure you do not have a urinary tract infection.  Blood tests.  A test that checks the heart's rhythm (electrocardiogram).  Talking with an anesthesiologist. This is the person who will be in charge of the medication (anesthesia) to keep you from feeling pain during the transurethral resection. You might be asleep during the procedure (general anesthesia) or numb from the waist down, but awake during the procedure (spinal anesthesia). Ask your surgeon what to expect.  The person who is having a transurethral resection needs to give what is called informed consent. This requires signing a legal paper that gives permission for the procedure. To give informed consent:  You must understand how the procedure is done and why.  You must be told all the risks and benefits of the procedure.  You must sign the consent. Sometimes a legal guardian can do this.  Signing should be witnessed by a healthcare professional.  The day before the surgery, eat only a light dinner. Then, do not eat or drink anything for at least 8 hours before the surgery. Ask your caregiver if it is OK to take any needed medicines with a sip of water.  Arrive at least an hour before the surgery or whenever your surgeon recommends. This will give you time to check in and fill out any needed paperwork. PROCEDURE  The preparation:  You will change into a hospital gown.  A needle will be inserted in your arm. This is an intravenous access tube (IV). Medication will be able to flow directly into your body through this needle.  Small monitors will be put on your body. They are used to check your heart, blood pressure, and oxygen level.  You might be given medication that will help you relax (sedative).  You will be given a general anesthetic or spinal anesthesia.  The procedure:  Once you are asleep or numb from the waist down, your legs will be placed in stirrups.  The  resectoscope will be passed through the urethra into the bladder.  Fluid will be passed through the resectoscope. This will fill the bladder with water.  The surgeon will examine the bladder through the scope. If the scope has a camera, it can take pictures from inside the bladder. They can be projected onto a TV screen.  The surgeon will use various tools to remove the tumor in small pieces. Sometimes a laser (a beam of light energy) is used. Other tools may use electric current.  A tube (catheter) will often be placed so that urine can drain into a bag outside the body. This process helps stop bleeding. This tube keeps blood clots from blocking the urethra.  The procedure usually takes 30 to 45 minutes. AFTER  THE PROCEDURE   You will stay in a recovery area until the anesthesia has worn off. Your blood pressure and pulse will be checked every so often. Then you will be taken to a hospital room.  You may continue to get fluids through the IV for awhile.  Some pain is normal. The catheter might be uncomfortable. Pain is usually not severe. If it is, ask for pain medicine.  Your urine may look bloody after a transurethral resection. This is normal.  If bleeding is heavy, a hospital caregiver may rinse out the bladder (irrigation) through the catheter.  Once the urine is clear, the catheter will be taken out.  You will need to stay in the hospital until you can urinate on your own.  Most people stay in the hospital for up to 4 days. PROGNOSIS   Transurethral resection is considered the best way to treat bladder tumors that are not too far along. For most people, the treatment is successful. Sometimes, though, more treatment is needed.  Bladder cancers can come back even after a successful procedure. Because of this, be sure to have a checkup with your caregiver every 3 to 6 months. If everything is OK for 3 years, you can reduce the checkups to once a year.   This information is not  intended to replace advice given to you by your health care provider. Make sure you discuss any questions you have with your health care provider.   Document Released: 10/20/2008 Document Revised: 03/18/2011 Document Reviewed: 12/26/2008 Elsevier Interactive Patient Education [employer].

## 2015-03-03 ENCOUNTER — Encounter (HOSPITAL_COMMUNITY)
Admission: RE | Admit: 2015-03-03 | Discharge: 2015-03-03 | Disposition: A | Discharge status: Home / Self Care | Payer: Self-pay | Source: Ambulatory Visit | Attending: Urology | Admitting: Urology

## 2015-03-03 ENCOUNTER — Encounter (HOSPITAL_COMMUNITY): Payer: Self-pay

## 2015-03-03 DIAGNOSIS — Z01812 Encounter for preprocedural laboratory examination: Secondary | ICD-10-CM | POA: Insufficient documentation

## 2015-03-03 DIAGNOSIS — R319 Hematuria, unspecified: Secondary | ICD-10-CM | POA: Insufficient documentation

## 2015-03-03 DIAGNOSIS — D494 Neoplasm of unspecified behavior of bladder: Secondary | ICD-10-CM | POA: Insufficient documentation

## 2015-03-03 LAB — CBC
HCT: 44.8 % (ref 39.0–52.0)
HEMOGLOBIN: 15.9 g/dL (ref 13.0–17.0)
MCH: 32.3 pg (ref 26.0–34.0)
MCHC: 35.5 g/dL (ref 30.0–36.0)
MCV: 91.1 fL (ref 78.0–100.0)
Platelets: 181 10*3/uL (ref 150–400)
RBC: 4.92 MIL/uL (ref 4.22–5.81)
RDW: 12.7 % (ref 11.5–15.5)
WBC: 6.5 10*3/uL (ref 4.0–10.5)

## 2015-03-03 LAB — BASIC METABOLIC PANEL
Anion gap: 8 (ref 5–15)
BUN: 11 mg/dL (ref 6–20)
CHLORIDE: 104 mmol/L (ref 101–111)
CO2: 28 mmol/L (ref 22–32)
CREATININE: 1.15 mg/dL (ref 0.61–1.24)
Calcium: 8.9 mg/dL (ref 8.9–10.3)
GFR calc Af Amer: >60 mL/min (ref >60)
GFR calc non Af Amer: >60 mL/min (ref >60)
GLUCOSE: 90 mg/dL (ref 65–99)
POTASSIUM: 4 mmol/L (ref 3.5–5.1)
SODIUM: 140 mmol/L (ref 135–145)

## 2015-03-03 NOTE — Pre-Procedure Instructions (Signed)
Patient given information to sign up for my chart at home. 

## 2015-03-06 NOTE — Progress Notes (Signed)
Dr Dahlstedt's office called. He will need epirubicin 40 mg and 40 ml of sterile water or saline. Mickel Baas in pharmacy notified.

## 2015-03-07 ENCOUNTER — Encounter (HOSPITAL_COMMUNITY): Payer: Self-pay | Admitting: *Deleted

## 2015-03-07 ENCOUNTER — Observation Stay (HOSPITAL_COMMUNITY)
Admission: RE | Admit: 2015-03-07 | Discharge: 2015-03-08 | Disposition: A | Discharge status: Home / Self Care | Payer: Self-pay | Source: Ambulatory Visit | Attending: Urology | Admitting: Urology

## 2015-03-07 ENCOUNTER — Encounter (HOSPITAL_COMMUNITY)
Admission: RE | Disposition: A | Discharge status: Home / Self Care | Payer: Self-pay | Source: Ambulatory Visit | Attending: Urology

## 2015-03-07 ENCOUNTER — Ambulatory Visit (HOSPITAL_COMMUNITY): Payer: Self-pay | Admitting: Anesthesiology

## 2015-03-07 ENCOUNTER — Ambulatory Visit (HOSPITAL_COMMUNITY): Payer: Self-pay

## 2015-03-07 DIAGNOSIS — C679 Malignant neoplasm of bladder, unspecified: Principal | ICD-10-CM | POA: Insufficient documentation

## 2015-03-07 DIAGNOSIS — F1290 Cannabis use, unspecified, uncomplicated: Secondary | ICD-10-CM | POA: Insufficient documentation

## 2015-03-07 DIAGNOSIS — C672 Malignant neoplasm of lateral wall of bladder: Secondary | ICD-10-CM | POA: Diagnosis present

## 2015-03-07 DIAGNOSIS — F17210 Nicotine dependence, cigarettes, uncomplicated: Secondary | ICD-10-CM | POA: Insufficient documentation

## 2015-03-07 HISTORY — PX: CYSTOSCOPY W/ RETROGRADES: SHX1426

## 2015-03-07 HISTORY — PX: TRANSURETHRAL RESECTION OF BLADDER TUMOR: SHX2575

## 2015-03-07 SURGERY — TURBT (TRANSURETHRAL RESECTION OF BLADDER TUMOR)
Anesthesia: General

## 2015-03-07 MED ORDER — FENTANYL CITRATE (PF) 100 MCG/2ML IJ SOLN
INTRAMUSCULAR | Status: AC
  Filled 2015-03-07: qty 2

## 2015-03-07 MED ORDER — CEFAZOLIN (ANCEF) 1 G IV SOLR: 1.0000 g | INTRAVENOUS | Status: DC

## 2015-03-07 MED ORDER — CEFAZOLIN SODIUM-DEXTROSE 2-3 GM-% IV SOLR: 2.0000 g | Freq: Once | INTRAVENOUS | Status: DC

## 2015-03-07 MED ORDER — FENTANYL CITRATE (PF) 100 MCG/2ML IJ SOLN
25.0000 ug | INTRAMUSCULAR | Status: DC | PRN
  Administered 2015-03-07: 50 ug via INTRAVENOUS
  Filled 2015-03-07: qty 2

## 2015-03-07 MED ORDER — DOCUSATE SODIUM 100 MG PO CAPS: 100.0000 mg | ORAL_CAPSULE | Freq: Two times a day (BID) | ORAL | Status: DC

## 2015-03-07 MED ORDER — LIDOCAINE HCL 1 % IJ SOLN
INTRAMUSCULAR | Status: DC | PRN
  Administered 2015-03-07: 25 mg via INTRADERMAL

## 2015-03-07 MED ORDER — CEFAZOLIN SODIUM 1-5 GM-% IV SOLN
1.0000 g | Freq: Once | INTRAVENOUS | Status: AC
  Administered 2015-03-07: 1 g via INTRAVENOUS

## 2015-03-07 MED ORDER — PROPOFOL 10 MG/ML IV BOLUS
INTRAVENOUS | Status: AC
  Filled 2015-03-07: qty 20

## 2015-03-07 MED ORDER — MIDAZOLAM HCL 2 MG/2ML IJ SOLN
1.0000 mg | INTRAMUSCULAR | Status: DC | PRN
  Administered 2015-03-07: 2 mg via INTRAVENOUS

## 2015-03-07 MED ORDER — ACETAMINOPHEN 325 MG PO TABS: 650.0000 mg | ORAL_TABLET | ORAL | Status: DC | PRN

## 2015-03-07 MED ORDER — SODIUM CHLORIDE 0.9 % IR SOLN
Status: DC | PRN
  Administered 2015-03-07: 3000 mL

## 2015-03-07 MED ORDER — FENTANYL CITRATE (PF) 100 MCG/2ML IJ SOLN
25.0000 ug | INTRAMUSCULAR | Status: AC
  Administered 2015-03-07 (×2): 25 ug via INTRAVENOUS

## 2015-03-07 MED ORDER — PROPOFOL 10 MG/ML IV BOLUS
INTRAVENOUS | Status: DC | PRN
  Administered 2015-03-07: 130 mg via INTRAVENOUS

## 2015-03-07 MED ORDER — MIDAZOLAM HCL 2 MG/2ML IJ SOLN
INTRAMUSCULAR | Status: AC
  Filled 2015-03-07: qty 2

## 2015-03-07 MED ORDER — HYDROCODONE-ACETAMINOPHEN 5-325 MG PO TABS
1.0000 | ORAL_TABLET | ORAL | Status: DC | PRN
  Administered 2015-03-07 – 2015-03-08 (×4): 2 via ORAL
  Filled 2015-03-07 (×4): qty 2

## 2015-03-07 MED ORDER — SULFAMETHOXAZOLE-TRIMETHOPRIM 800-160 MG PO TABS: 1.0000 | ORAL_TABLET | Freq: Two times a day (BID) | ORAL | Status: DC

## 2015-03-07 MED ORDER — LIDOCAINE HCL (PF) 1 % IJ SOLN
INTRAMUSCULAR | Status: AC
  Filled 2015-03-07: qty 5

## 2015-03-07 MED ORDER — OXYBUTYNIN CHLORIDE 5 MG PO TABS: 5.0000 mg | ORAL_TABLET | Freq: Three times a day (TID) | ORAL | Status: DC | PRN

## 2015-03-07 MED ORDER — SODIUM CHLORIDE 0.45 % IV SOLN
INTRAVENOUS | Status: DC
  Administered 2015-03-07: 1 mL via INTRAVENOUS

## 2015-03-07 MED ORDER — INFLUENZA VAC SPLIT QUAD 0.5 ML IM SUSY: 0.5000 mL | PREFILLED_SYRINGE | INTRAMUSCULAR | Status: DC

## 2015-03-07 MED ORDER — WATER FOR IRRIGATION, STERILE IR SOLN
Status: DC | PRN
  Administered 2015-03-07: 1000 mL

## 2015-03-07 MED ORDER — FENTANYL CITRATE (PF) 100 MCG/2ML IJ SOLN
INTRAMUSCULAR | Status: DC | PRN
  Administered 2015-03-07 (×2): 25 ug via INTRAVENOUS
  Administered 2015-03-07: 50 ug via INTRAVENOUS

## 2015-03-07 MED ORDER — MIDAZOLAM HCL 5 MG/5ML IJ SOLN
INTRAMUSCULAR | Status: DC | PRN
  Administered 2015-03-07: 2 mg via INTRAVENOUS

## 2015-03-07 MED ORDER — LACTATED RINGERS IV SOLN
INTRAVENOUS | Status: DC
  Administered 2015-03-07: 12:00:00 via INTRAVENOUS

## 2015-03-07 MED ORDER — SODIUM CHLORIDE 0.9 % IV SOLN
50.0000 mg | Freq: Once | INTRAVENOUS | Status: DC
  Filled 2015-03-07: qty 25

## 2015-03-07 MED ORDER — ONDANSETRON HCL 4 MG/2ML IJ SOLN: 4.0000 mg | INTRAMUSCULAR | Status: DC | PRN

## 2015-03-07 MED ORDER — ZOLPIDEM TARTRATE 5 MG PO TABS
5.0000 mg | ORAL_TABLET | Freq: Every evening | ORAL | Status: DC | PRN
  Administered 2015-03-08: 5 mg via ORAL
  Filled 2015-03-07: qty 1

## 2015-03-07 MED ORDER — ONDANSETRON HCL 4 MG/2ML IJ SOLN: 4.0000 mg | Freq: Once | INTRAMUSCULAR | Status: DC | PRN

## 2015-03-07 MED ORDER — CEFAZOLIN SODIUM 1-5 GM-% IV SOLN
INTRAVENOUS | Status: AC
  Filled 2015-03-07: qty 50

## 2015-03-07 MED ORDER — SODIUM CHLORIDE 0.9 % IR SOLN
Status: DC | PRN
  Administered 2015-03-07 (×2): 3000 mL

## 2015-03-07 SURGICAL SUPPLY — 29 items
BAG DRAIN URO TABLE W/ADPT NS (DRAPE) ×4 IMPLANT
BAG DRN 8 ADPR NS SKTRN CSTL (DRAPE) ×2
BAG HAMPER (MISCELLANEOUS) ×4 IMPLANT
BAG URINE LEG 500ML (DRAIN) ×4 IMPLANT
CATH FOLEY 2WAY SLVR  5CC 18FR (CATHETERS)
CATH FOLEY 2WAY SLVR  5CC 20FR (CATHETERS) ×2
CATH FOLEY 2WAY SLVR 5CC 18FR (CATHETERS) IMPLANT
CATH FOLEY 2WAY SLVR 5CC 20FR (CATHETERS) IMPLANT
CATH INTERMIT  6FR 70CM (CATHETERS) ×4 IMPLANT
CLOTH BEACON ORANGE TIMEOUT ST (SAFETY) ×4 IMPLANT
DECANTER SPIKE VIAL GLASS SM (MISCELLANEOUS) ×4 IMPLANT
GLOVE BIOGEL M 8.0 STRL (GLOVE) ×4 IMPLANT
GOWN STRL REUS W/TWL LRG LVL3 (GOWN DISPOSABLE) ×4 IMPLANT
GOWN STRL REUS W/TWL XL LVL3 (GOWN DISPOSABLE) ×4 IMPLANT
GUIDEWIRE STR DUAL SENSOR (WIRE) ×4 IMPLANT
IV NS IRRIG 3000ML ARTHROMATIC (IV SOLUTION) ×8 IMPLANT
KIT ROOM TURNOVER AP CYSTO (KITS) ×4 IMPLANT
LOOP CUT BIPOLAR 24F LRG (ELECTROSURGICAL) ×2 IMPLANT
MANIFOLD NEPTUNE II (INSTRUMENTS) ×4 IMPLANT
NDL HYPO 18GX1.5 BLUNT FILL (NEEDLE) ×2 IMPLANT
NEEDLE HYPO 18GX1.5 BLUNT FILL (NEEDLE) ×4 IMPLANT
NS IRRIG 1000ML POUR BTL (IV SOLUTION) ×6 IMPLANT
PACK CYSTO (CUSTOM PROCEDURE TRAY) ×4 IMPLANT
PAD ARMBOARD 7.5X6 YLW CONV (MISCELLANEOUS) ×4 IMPLANT
PAD TELFA 3X4 1S STER (GAUZE/BANDAGES/DRESSINGS) ×4 IMPLANT
PLUG CATH AND CAP STER (CATHETERS) ×2 IMPLANT
SYRINGE 10CC LL (SYRINGE) ×2 IMPLANT
TOWEL OR 17X26 4PK STRL BLUE (TOWEL DISPOSABLE) ×4 IMPLANT
WATER STERILE IRR 1000ML POUR (IV SOLUTION) ×4 IMPLANT

## 2015-03-07 NOTE — Discharge Instructions (Signed)
Transurethral Resection of Bladder Tumor (TURBT)  °Definition:  °Transurethral Resection of the Bladder Tumor is a surgical procedure used to diagnose and remove tumors within the bladder. TURBT is the most common treatment for early stage bladder cancer.  ° °General instructions:  °Your recent bladder surgery requires very little post hospital care but some definite precautions.  °Despite the fact that no skin incisions were used, the area around the tumor removal site is raw and covered with scabs to promote healing and prevent bleeding. Certain precautions are needed to insure that the scabs are not disturbed over the next 2-4 weeks while the healing proceeds.  °Because the raw surface inside your bladder and the irritating effects of urine you may expect frequency of urination and/or urgency (a stronger desire to urinate) and perhaps even getting up at night more often. This will usually resolve or improve slowly over the healing period. You may see some blood in your urine over the first 6 weeks. Do not be alarmed, even if the urine was clear for a while. Get off your feet and drink lots of fluids until clearing occurs. If you start to pass clots or don't improve call us.  ° °Diet:  °You may return to your normal diet immediately. Because of the raw surface of your bladder, alcohol, spicy foods, foods high in acid and drinks with caffeine may cause irritation or frequency and should be used in moderation. To keep your urine flowing freely and avoid constipation, drink plenty of fluids during the day (8-10 glasses). Tip: Avoid cranberry juice because it is very acidic. °  °Activity:  °Your physical activity needs to be restricted somewhat.  We suggest that you reduce your activity under the circumstances until the bleeding has stopped. Heavy lifting (greater than 20 lbs.) and heavy exertion should be limited for 2-3 weeks. ° °Bowels:  °It is important to keep your bowels regular during the postoperative period.  Straining with bowel movements can cause bleeding. A bowel movement every other day is reasonable. Use a mild laxative if needed, such as milk of magnesia 2-3 tablespoons, or 2 Dulcolax tablets. Call if you continue to have problems. If you had been taking narcotics for pain, before, during or after your surgery, you may be constipated.  ° °Medication:  °You should resume your pre-surgery medications unless told not to. In addition you may be given an antibiotic to prevent or treat infection. Antibiotics are not always necessary. All medication should be taken as prescribed until the bottles are finished unless you are having an unusual reaction to one of the drugs.  °General Anesthetic, Adult  °A doctor specialized in giving anesthesia (anesthesiologist) or a nurse specialized in giving anesthesia (nurse anesthetist) gives medicine that makes you sleep while a procedure is performed (general anesthetic). Once the general anesthetic has been administered, you will be in a sleeplike state in which you feel no pain. After having a general anesthetic you may feel:  °Dizzy.  °Weak.  °Drowsy.  °Confused.  °These feelings are normal and can be expected to last for up to 24 hours after the procedure. ° ° °LET YOUR CAREGIVER KNOW ABOUT:  °Allergies you have.  °Medications you are taking, including herbs, eye drops, over the counter medications, dietary supplements, and creams.  °Previous problems you have had with anesthetics or numbing medicines.  °Use of cigarettes, alcohol, or illicit drugs.  °Possibility of pregnancy, if this applies.  °History of bleeding or blood disorders, including blood clots and clotting   disorders.  °Previous surgeries you have had and types of anesthetics you have received.  °Family medical history, especially anesthetic problems.  °Other health problems.  ° °AFTER THE PROCEDURE  °After surgery, you will be taken to the recovery area where a nurse will monitor your progress. You will be allowed  to go home when you are awake, stable, taking fluids well, and without serious pain or complications.  °For the first 24 hours following an anesthetic:  °Have a responsible person with you.  °Do not drive a car. If you are alone, do not take public transportation.  °Do not engage in strenuous activity. You may usually resume normal activities the next day, or as advised by your caregiver.  °Do not drink alcohol.  °Do not take medicine that has not been prescribed by your caregiver.  °Do not sign important papers or make important decisions as your judgement may be impaired.  °You may resume a normal diet as directed.  °Change bandages (dressings) as directed.  °Only take over-the-counter or prescription medicines for pain, discomfort, or fever as directed by your caregiver.  °If you have questions or problems that seem related to the anesthetic, call the hospital and ask for the anesthetist, anesthesiologist, or anesthesia department.  ° °SEEK IMMEDIATE MEDICAL CARE IF:  °You develop a rash.  °You have difficulty breathing.  °You have chest pain.  °You have allergic problems.  °You have uncontrolled nausea.  °You have uncontrolled vomiting.  °You develop any serious bleeding, especially from the incision site.  °Document Released: 04/02/2007 Document Revised: 09/05/2010 Document Reviewed: 04/26/2010  °ExitCare® Patient Information ©2012 [employer].  ° ° °

## 2015-03-07 NOTE — Anesthesia Procedure Notes (Signed)
Procedure Name: LMA Insertion Date/Time: 03/07/2015 12:55 PM Performed by: Charmaine Downs Pre-anesthesia Checklist: Patient identified, Emergency Drugs available, Suction available and Patient being monitored Patient Re-evaluated:Patient Re-evaluated prior to inductionOxygen Delivery Method: Circle system utilized Preoxygenation: Pre-oxygenation with 100% oxygen Intubation Type: IV induction Ventilation: Mask ventilation without difficulty LMA: LMA inserted LMA Size: 4.0 Grade View: Grade I Number of attempts: 1 Placement Confirmation: breath sounds checked- equal and bilateral and positive ETCO2 Tube secured with: Tape Dental Injury: Teeth and Oropharynx as per pre-operative assessment

## 2015-03-07 NOTE — Transfer of Care (Signed)
Immediate Anesthesia Transfer of Care Note  Patient: David Stout  Procedure(s) Performed: Procedure(s): TRANSURETHRAL RESECTION OF BLADDER TUMOR (TURBT) (N/A) CYSTOSCOPY WITH RETROGRADE PYELOGRAM (Bilateral)  Patient Location: PACU  Anesthesia Type:General  Level of Consciousness: awake and patient cooperative  Airway & Oxygen Therapy: Patient Spontanous Breathing and Patient connected to face mask oxygen  Post-op Assessment: Report given to RN, Post -op Vital signs reviewed and stable and Patient moving all extremities  Post vital signs: Reviewed and stable  Last Vitals:  Filed Vitals:   03/07/15 1230 03/07/15 1240  BP: 89/48   Pulse:    Temp:    Resp: 29 14    Complications: No apparent anesthesia complications

## 2015-03-07 NOTE — H&P (Signed)
  Urology History and Physical Exam  CC: bladder cancer  HPI: 20 year old male presents at this time for surgical management of a recently papillary bladder lesion.  This was found on 02/21/2015.  The patient following recent emergency room visit for evaluation and management of persistent/intermittent gross hematuria.  The patient had cystoscopy in the office revealing  3 cm papillary bladder lesion on the right bladder wall.  This was consistent with that urothelial carcinoma.  He presents  For TURBT.  I have also discussed placing epirubicin in his bladder post procedure to decrease his risk of recurrent cancer. Risks and complications of the procedure have been discussed with the patient  PMH: Past Medical History  Diagnosis Date  . Hematuria 2014    PSH: Past Surgical History  Procedure Laterality Date  . Appendectomy  2006    Allergies: No Known Allergies  Medications: No prescriptions prior to admission     Social History: Social History   Social History  . Marital Status: Single    Spouse Name: N/A  . Number of Children: N/A  . Years of Education: N/A   Occupational History  . Not on file.   Social History Main Topics  . Smoking status: Current Every Day Smoker -- 1.00 packs/day for 6 years    Types: Cigarettes  . Smokeless tobacco: Not on file  . Alcohol Use: Yes     Comment: occassional  . Drug Use: Yes    Special: Marijuana     Comment: daily-last used 03/02/2015.  Marland Kitchen Sexual Activity: Yes    Birth Control/ Protection: None   Other Topics Concern  . Not on file   Social History Narrative    Family History: No family history on file.  Review of Systems: Positive: intermittent gross painless hematuria intermittent slow stream Negative:   A further 10 point review of systems was negative except what is listed in the HPI.                  Physical Exam: @VITALS2 @ General: No acute distress.  Awake. Head:  Normocephalic.  Atraumatic. ENT:  EOMI.   Mucous membranes moist Neck:  Supple.  No lymphadenopathy. CV:  S1 present. S2 present. Regular rate. Pulmonary: Equal effort bilaterally.  Clear to auscultation bilaterally. Abdomen: Soft.  Non- tender to palpation. Skin:  Normal turgor.  No visible rash. Extremity: No gross deformity of bilateral upper extremities.  No gross deformity of                             lower extremities. Neurologic: Alert. Appropriate mood.  Penis:  circumcised.  No lesions. Urethra: Orthotopic meatus. Scrotum: No lesions.  No ecchymosis.  No erythema. Testicles: Descended bilaterally.  No masses bilaterally. Epididymis: Palpable bilaterally. Nontender to palpation.  Studies:  No results for input(s): HGB, WBC, PLT in the last 72 hours.  No results for input(s): NA, K, CL, CO2, BUN, CREATININE, CALCIUM, GFRNONAA, GFRAA in the last 72 hours.  Invalid input(s): MAGNESIUM   No results for input(s): INR, APTT in the last 72 hours.  Invalid input(s): PT   Invalid input(s): ABG    Assessment:  3 cm bladder tumor  Plan: Cystoscopy, bilateral retrograde ureteropyelograms, TURBT, probable placement of epirubicin  intravesically

## 2015-03-07 NOTE — OR Nursing (Signed)
Up to bathroom to void 

## 2015-03-07 NOTE — Op Note (Signed)
Preoperative diagnosis: 3 cm bladder tumor  Postoperative diagnosis: Same  Procedure procedure: Cystoscopy, bilateral retrograde ureteropyelograms with interpretive fluoroscopy, TURBT of 3 cm bladder tumor, placement of epirubicin and bladder postoperatively  Surgeon: Ebonique Hallstrom  Anesthesia: Gen. with LMA  Complications: None  Specimens: 1. Bladder tumor 2. Bladder tumor base  Drains: 1 French Foley catheter, to straight drain  Indications: 20 year old male with recently diagnosed bladder mass. This was picked up on a CT scan done for gross hematuria over a period of 1-2 years. Cystoscopy in the office revealed a 3 cm papillary lesion on the right bladder wall. He presents at this time for TURBT and bilateral retrograde pyelograms. Also, postoperatively, I recommended we place intravesical chemotherapy, I will place epirubicin for this.  Description of procedure: The patient was properly identified in the holding area. He received IV Ancef. He was taken the operating room where general anesthetic was administered. He was placed in the dorsolithotomy position. Genitalia and perineum were prepped and draped. Proper timeout was performed.  His urethral meatus was dilated 28 [place] with [employer] sounds. A 23 French panendoscope was advanced into his bladder. Urethra was normal, without lesions or stricture. The prostate was nonobstructive. The bladder was entered and inspected circumferentially. There was a large tumor adhering to the bladder wall just lateral to the right ureteral orifice. No other lesions were seen. This was a papillary lesion.  Bilateral retrograde ureteropyelograms were performed using a 6 [place] open-ended catheter. Omnipaque was used for each of these. Both retrograde revealed normal ureters throughout, no filling defects or hydronephrosis. Pyelo-calyceal systems were in addition normal, without hydronephrosis or filling defects. Following completion of the  ureteropyelograms, the open-ended catheter and cystoscope were removed.  I then placed a 28 French resectoscope sheath using an obturator. The cutting loop was then placed. Using bipolar energy and saline as irrigant, the bladder tumor was resected at its base. The base was perhaps 1 cm in diameter. Resection was quite nice, with just a small amount of resection carried out, releasing the whole bladder tumor. The bladder tumor was then irrigated from the bladder using a [place] syringe. This was sent for permanent pathology labeled "bladder tumor". 2-3 biopsies were taken at the base of the bladder tumor, both with the cold cup biopsy forceps as well as the cutting loop. The specimens were sent as "bladder tumor base". Cautery was applied to the bladder tumor base to achieve hemostasis. This was excellent at that point. The resected area was well away from the right ureteral orifice. Following achievement of hemostasis, the resectoscope was removed. I then placed a 20 French Foley catheter, the balloon filled with 10 mL of water. The bladder was then drained and the plug applied to the catheter.  The patient was then awakened and taken to the PACU in stable condition, having tolerated procedure well.  In the PACU, I then administered 50 mg of epirubicin-this will be left in for 1 hour.

## 2015-03-07 NOTE — Anesthesia Postprocedure Evaluation (Signed)
Anesthesia Post Note  Patient: David Stout  Procedure(s) Performed: Procedure(s) (LRB): TRANSURETHRAL RESECTION OF BLADDER TUMOR (TURBT) (N/A) CYSTOSCOPY WITH RETROGRADE PYELOGRAM (Bilateral)  Patient location during evaluation: PACU Anesthesia Type: General Level of consciousness: awake and alert, oriented and patient cooperative Pain management: pain level controlled Vital Signs Assessment: post-procedure vital signs reviewed and stable Respiratory status: respiratory function stable, nonlabored ventilation and patient connected to face mask oxygen Cardiovascular status: blood pressure returned to baseline Postop Assessment: no signs of nausea or vomiting Anesthetic complications: no    Last Vitals:  Filed Vitals:   03/07/15 1230 03/07/15 1240  BP: 89/48   Pulse:    Temp:    Resp: 29 14    Last Pain: There were no vitals filed for this visit.               Guelda Batson J

## 2015-03-07 NOTE — Anesthesia Preprocedure Evaluation (Signed)
Anesthesia Evaluation  Patient identified by MRN, date of birth, ID band Patient awake    Reviewed: Allergy & Precautions, NPO status , Patient's Chart, lab work & pertinent test results  Airway Mallampati: I  TM Distance: >3 FB     Dental  (+) Teeth Intact   Pulmonary Current Smoker,    breath sounds clear to auscultation       Cardiovascular negative cardio ROS   Rhythm:Regular Rate:Normal     Neuro/Psych    GI/Hepatic negative GI ROS, (+)     substance abuse  marijuana use,   Endo/Other    Renal/GU      Musculoskeletal   Abdominal   Peds  Hematology   Anesthesia Other Findings   Reproductive/Obstetrics                             Anesthesia Physical Anesthesia Plan  ASA: I  Anesthesia Plan: General   Post-op Pain Management:    Induction: Intravenous  Airway Management Planned: LMA  Additional Equipment:   Intra-op Plan:   Post-operative Plan: Extubation in OR  Informed Consent: I have reviewed the patients History and Physical, chart, labs and discussed the procedure including the risks, benefits and alternatives for the proposed anesthesia with the patient or authorized representative who has indicated his/her understanding and acceptance.     Plan Discussed with:   Anesthesia Plan Comments:         Anesthesia Quick Evaluation

## 2015-03-08 MED ORDER — TRAMADOL HCL 50 MG PO TABS: 50.0000 mg | ORAL_TABLET | Freq: Four times a day (QID) | ORAL | Status: AC | PRN

## 2015-03-08 MED ORDER — OXYBUTYNIN CHLORIDE 5 MG PO TABS: 5.0000 mg | ORAL_TABLET | Freq: Three times a day (TID) | ORAL | Status: AC | PRN

## 2015-03-08 NOTE — Progress Notes (Signed)
3 cups of urine collected from night shift after patient's foley catheter removed.  3rd collection more pink urine with no clots.  Dr. Diona Fanti notified.  Patient to be discharged home today.

## 2015-03-08 NOTE — Anesthesia Postprocedure Evaluation (Signed)
Anesthesia Post Note  Patient: David Stout  Procedure(s) Performed: Procedure(s) (LRB): TRANSURETHRAL RESECTION OF BLADDER TUMOR (TURBT) (N/A) CYSTOSCOPY WITH RETROGRADE PYELOGRAM (Bilateral)  Patient location during evaluation: Nursing Unit Anesthesia Type: General Level of consciousness: awake and alert, oriented and patient cooperative Pain management: pain level controlled Vital Signs Assessment: post-procedure vital signs reviewed and stable Respiratory status: nonlabored ventilation and respiratory function stable Cardiovascular status: blood pressure returned to baseline Postop Assessment: no signs of nausea or vomiting and adequate PO intake Anesthetic complications: no    Last Vitals:  Filed Vitals:   03/08/15 0123 03/08/15 0400  BP: 123/65 104/54  Pulse: 55 47  Temp: 36.9 C 36.7 C  Resp: 20 20    Last Pain:  Filed Vitals:   03/08/15 0405  PainSc: 5                  Najai Waszak J

## 2015-03-08 NOTE — Addendum Note (Signed)
Addendum  created 03/08/15 [id number] by Charmaine Downs, CRNA   Modules edited: Clinical Notes   Clinical Notes:  File: [id number]

## 2015-03-08 NOTE — Plan of Care (Signed)
Report received. Assume care of patient. Ruben Im SN [facility]

## 2015-03-08 NOTE — Progress Notes (Signed)
Patient walked in hallway with minimal assistance.  Tolerated walk well, no issues noted at this time.

## 2015-03-09 ENCOUNTER — Encounter (HOSPITAL_COMMUNITY): Payer: Self-pay | Admitting: Urology

## 2015-03-09 NOTE — Progress Notes (Signed)
AVS reviewed with patient.  Verbalized understanding of discharge instructions, physician follow-up, medications.  Patient's IV removed.  Site WNL.  Patient escorted to main entrance for discharge.  Patient stable at time of discharge.

## 2015-03-17 NOTE — Discharge Summary (Signed)
Patient ID: David Stout MRN: [id number] DOB/AGE: 22/22/1997 22 y.o.  Admit date: 03/07/2015 Discharge date: 03/17/2015  Primary Care Physician:  Redge Gainer, MD  Discharge Diagnoses:   Present on Admission:  . Malignant neoplasm of lateral wall of bladder ([facility])  Consults:  None     Discharge Medications:   Medication List    TAKE these medications        oxybutynin 5 MG tablet  Commonly known as:  DITROPAN  Take 1 tablet (5 mg total) by mouth every 8 (eight) hours as needed for bladder spasms.     traMADol 50 MG tablet  Commonly known as:  ULTRAM  Take 1 tablet (50 mg total) by mouth every 6 (six) hours as needed for moderate pain.         Significant Diagnostic Studies:  No results found.  Brief H and P: For complete details please refer to admission H and P, but in brief the patient is admitted for TURBT of a symptomatic 3 cm right posterior bladder tumor.  Hospital Course:  Active Problems:   Malignant neoplasm of lateral wall of bladder ([place])  patient was admitted to the floor following his TURBT and mitomycin instillation which was present for an hour. His catheter was removed on postoperative day #1. At that point, he voided adequately and was discharged.  Day of Discharge BP 117/81 mmHg  Pulse 64  Temp(Src) 97.8 F (36.6 C) (Oral)  Resp 19  Ht 5\' 7"  (1.702 m)  Wt 59.875 kg (132 lb)  BMI 20.67 kg/m2  SpO2 100%  No results found for this or any previous visit (from the past 24 hour(s)).  Physical Exam: General: Alert and awake oriented x3 not in any acute distress. HEENT: anicteric sclera, pupils reactive to light and accommodation CVS: S1-S2 clear no murmur rubs or gallops Chest: clear to auscultation bilaterally, no wheezing rales or rhonchi Abdomen: soft nontender, nondistended, normal bowel sounds, no organomegaly Extremities: no cyanosis, clubbing or edema noted bilaterally Neuro: Cranial nerves II-XII intact, no focal neurological  deficits  Disposition:  Home  Diet:  No restrictions  Activity:  Discussed with patient   Disposition and Follow-up:     Discharge Instructions    Discharge patient    Complete by:  As directed             He was discharged home, follow-up has been scheduled  TESTS THAT NEED FOLLOW-UP  Pathology review  DISCHARGE FOLLOW-UP Follow-up Information    Follow up with Jorja Loa, MD.   Specialty:  Urology   Why:  we will call you   Contact information:   [address] [place] [address] [phone]       Time spent on Discharge:  5 minutes  Signed: Jorja Loa 03/17/2015, 7:42 PM

## 2015-04-11 ENCOUNTER — Ambulatory Visit: Payer: Self-pay | Admitting: Urology

## 2015-07-18 ENCOUNTER — Ambulatory Visit: Payer: Self-pay | Admitting: Urology

## 2016-02-19 IMAGING — CT CT RENAL STONE PROTOCOL
3 of 4 series · 10 of 46 positions shown, 15 images · non-contrast
Comparison: CT 07/18/2012

CLINICAL DATA: Flank pain with hematuria left-sided pain

EXAM:
CT ABDOMEN AND PELVIS WITHOUT CONTRAST
TECHNIQUE: Multidetector CT imaging of the abdomen and pelvis was performed
following the standard protocol without IV contrast.

[Series 3: mpr coronal (id) · coronal · 0.58mm/px · 3 of 79 slices shown]
[im 27/79  soft-tissue]
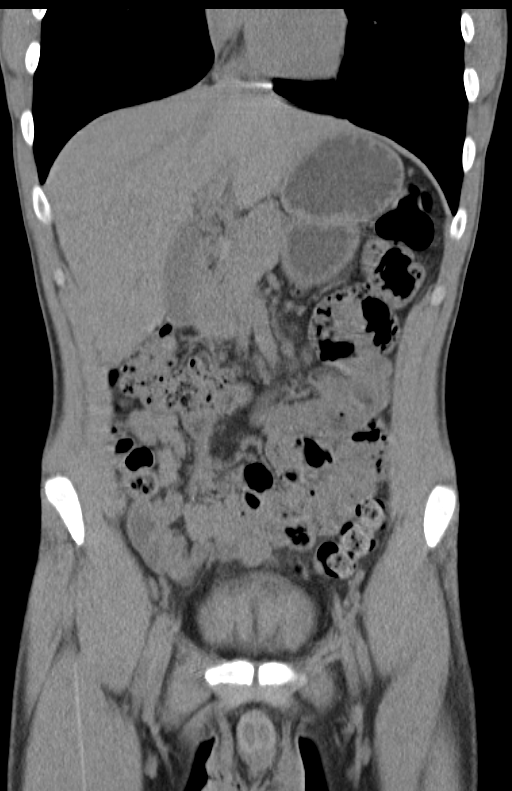
[im 35/79  soft-tissue]
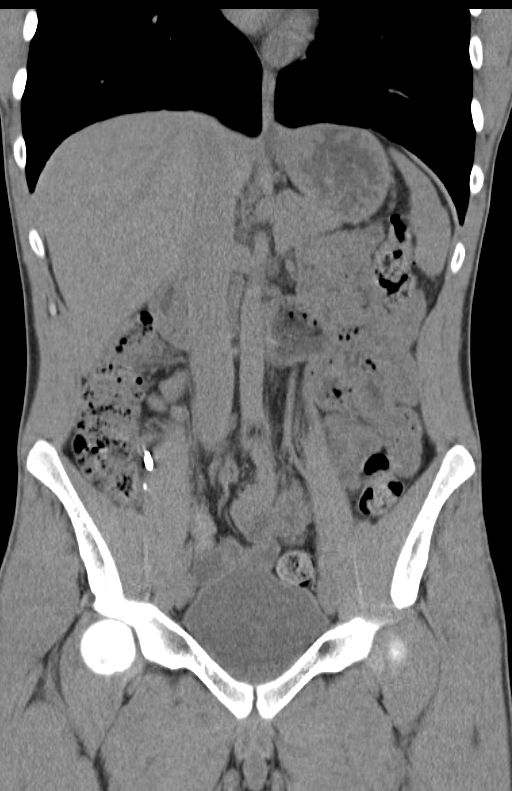
[im 44/79  soft-tissue]
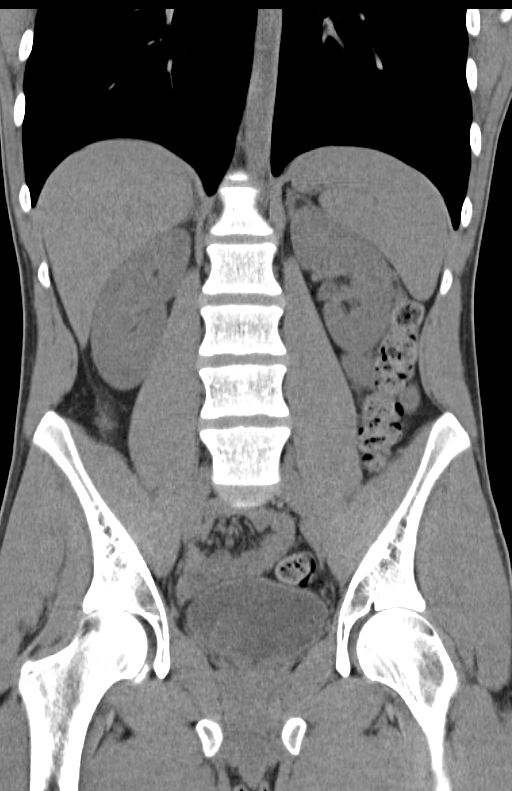

[Series 4: mpr sagittal (id) · sagittal · 0.55mm/px · 1 of 109 slices shown]
[im 37/109  soft-tissue]
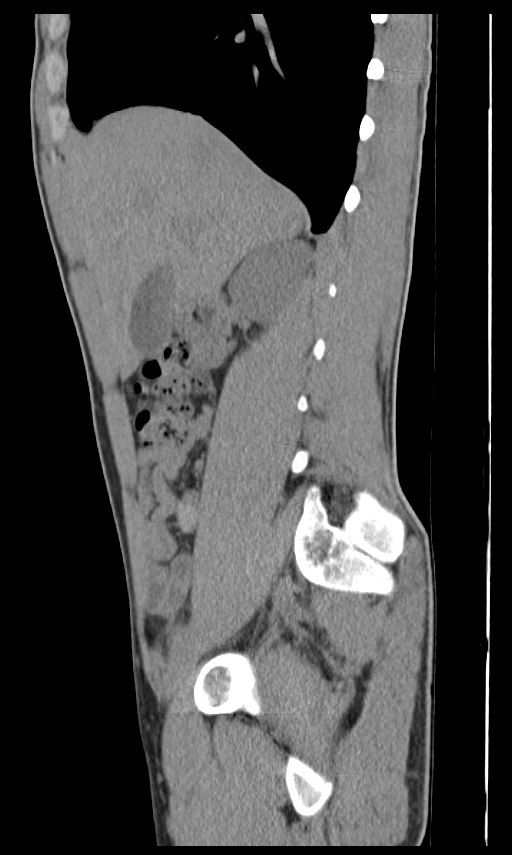

[Series 6: lung 5.0 b60f · axial · 0.66mm/px · z∈[+1046,+1146]mm · 6 of 30 slices shown, 11 images]
[im 5/30  soft-tissue]
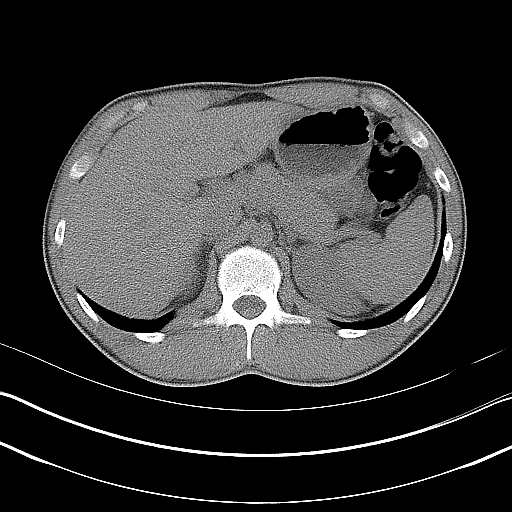
[im 5/30  bone]
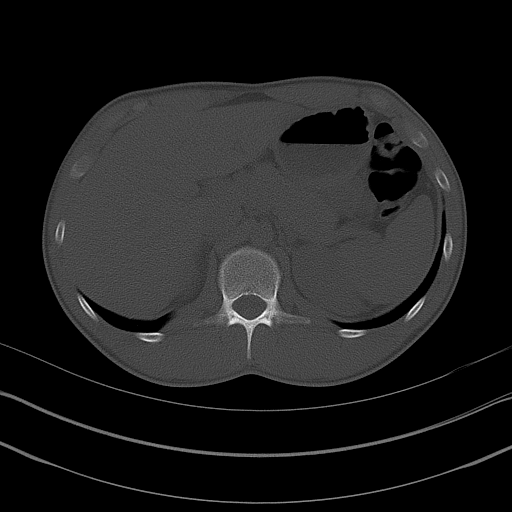
[im 9/30  soft-tissue]
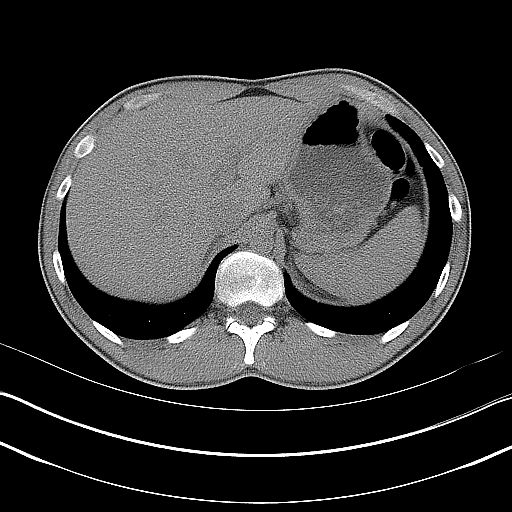
[im 13/30  soft-tissue]
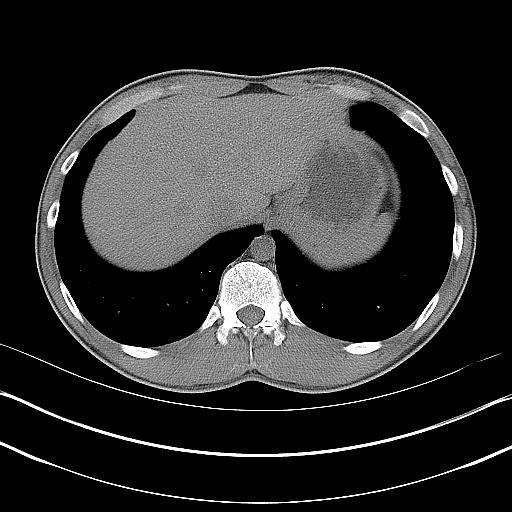
[im 13/30  lung]
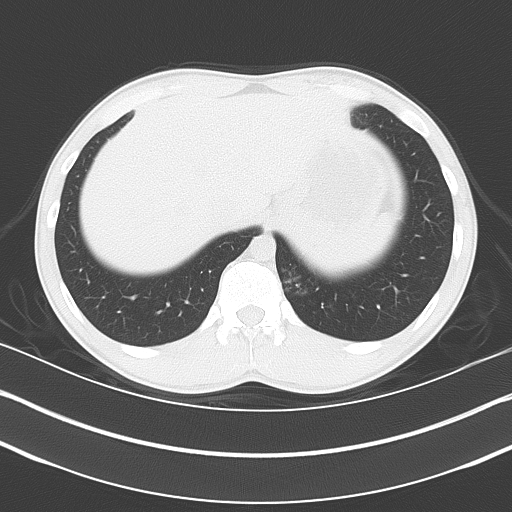
[im 17/30  soft-tissue]
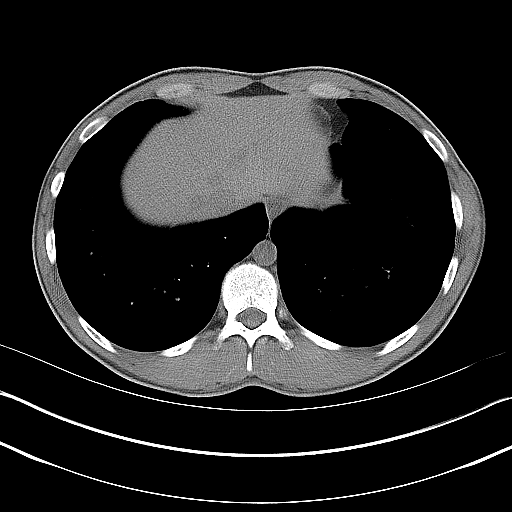
[im 17/30  lung]
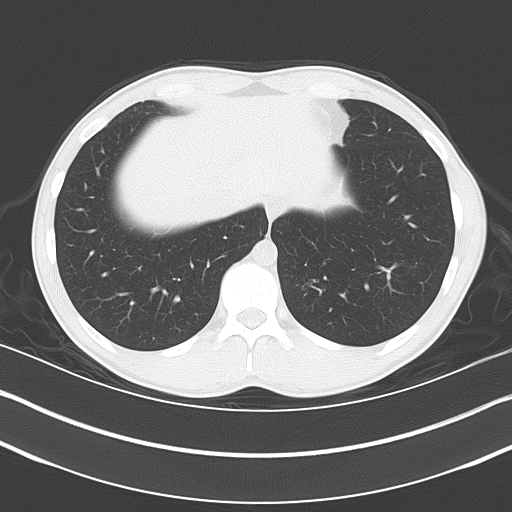
[im 21/30  soft-tissue]
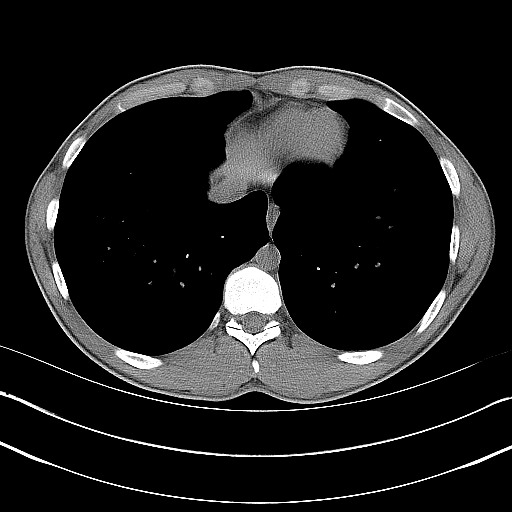
[im 21/30  lung]
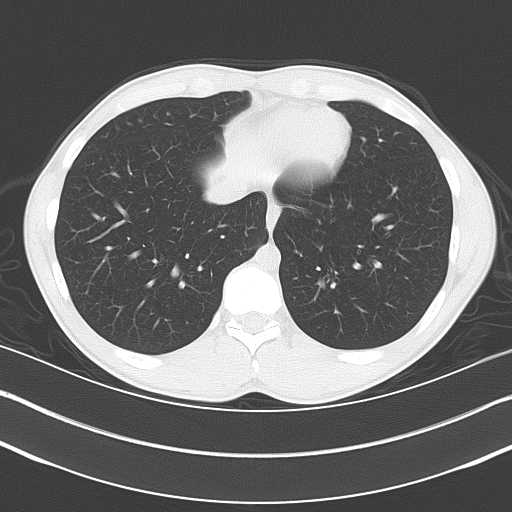
[im 25/30  soft-tissue]
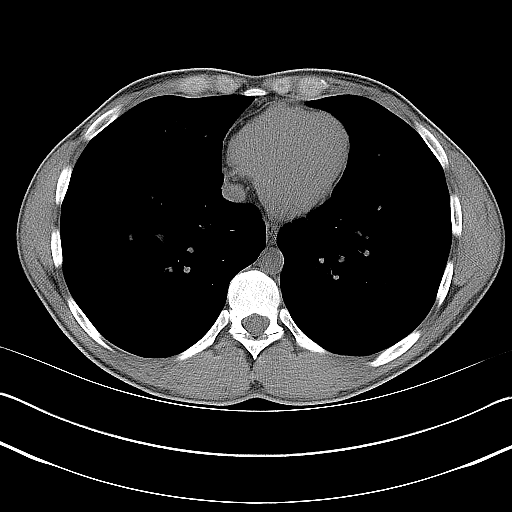
[im 25/30  lung]
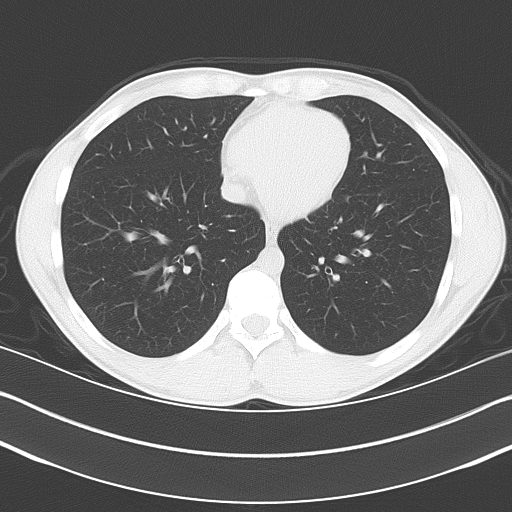

[10 of 46 positions shown; findings below may reference images not displayed]

FINDINGS: Lower chest:  Lung bases clear without infiltrate or effusion.

Hepatobiliary: Liver normal in size and contour without focal
lesion. Gallbladder and bile ducts normal.

Pancreas: Negative

Spleen: Negative

Adrenals/Urinary Tract: No renal obstruction or mass. No urinary
tract calculi. Vague density in the right bladder may represent
blood clot versus tumor. This measures approximately 2.5 cm in
diameter.

Stomach/Bowel: Negative for bowel obstruction. No bowel edema.
Appendectomy clips right lower quadrant.

Vascular/Lymphatic: Normal aorta and IVC.  No lymphadenopathy.

Reproductive: Normal prostate

Other: No free fluid in the pelvis or abdomen.

Musculoskeletal: No fracture or bone lesion identified.
IMPRESSION: Negative for urinary tract calculi.  No renal obstruction

Vague density in the right bladder may represent blood clot versus
tumor. The patient is currently give history of hematuria.

## 2018-08-25 ENCOUNTER — Ambulatory Visit (INDEPENDENT_AMBULATORY_CARE_PROVIDER_SITE_OTHER): Payer: Self-pay | Admitting: Urology

## 2018-08-25 DIAGNOSIS — C678 Malignant neoplasm of overlapping sites of bladder: Secondary | ICD-10-CM

## 2019-03-15 ENCOUNTER — Other Ambulatory Visit: Payer: Self-pay

## 2019-03-15 ENCOUNTER — Ambulatory Visit: Payer: HRSA Program | Attending: Internal Medicine

## 2019-03-15 DIAGNOSIS — Z20822 Contact with and (suspected) exposure to covid-19: Secondary | ICD-10-CM | POA: Insufficient documentation

## 2019-03-16 LAB — NOVEL CORONAVIRUS, NAA: SARS-CoV-2, NAA: NOT DETECTED

## 2021-04-25 ENCOUNTER — Encounter (HOSPITAL_COMMUNITY): Payer: Self-pay

## 2021-04-25 ENCOUNTER — Other Ambulatory Visit: Payer: Self-pay

## 2021-04-25 ENCOUNTER — Emergency Department (HOSPITAL_COMMUNITY)
Admission: EM | Admit: 2021-04-25 | Discharge: 2021-04-25 | Disposition: A | Discharge status: Home / Self Care | Payer: Self-pay | Attending: Emergency Medicine | Admitting: Emergency Medicine

## 2021-04-25 ENCOUNTER — Emergency Department (HOSPITAL_COMMUNITY): Payer: Self-pay

## 2021-04-25 DIAGNOSIS — R079 Chest pain, unspecified: Secondary | ICD-10-CM | POA: Insufficient documentation

## 2021-04-25 DIAGNOSIS — R55 Syncope and collapse: Secondary | ICD-10-CM | POA: Insufficient documentation

## 2021-04-25 DIAGNOSIS — Z720 Tobacco use: Secondary | ICD-10-CM | POA: Insufficient documentation

## 2021-04-25 LAB — URINALYSIS, ROUTINE W REFLEX MICROSCOPIC
Bacteria, UA: NONE SEEN
Bilirubin Urine: NEGATIVE
Glucose, UA: NEGATIVE mg/dL
Hgb urine dipstick: NEGATIVE
Ketones, ur: 5 mg/dL — AB
Leukocytes,Ua: NEGATIVE
Nitrite: NEGATIVE
Protein, ur: 30 mg/dL — AB
Specific Gravity, Urine: 1.028 (ref 1.005–1.030)
pH: 5 (ref 5.0–8.0)

## 2021-04-25 LAB — CBC WITH DIFFERENTIAL/PLATELET
Abs Immature Granulocytes: 0.01 10*3/uL (ref 0.00–0.07)
Basophils Absolute: 0.1 10*3/uL (ref 0.0–0.1)
Basophils Relative: 1 %
Eosinophils Absolute: 0.1 10*3/uL (ref 0.0–0.5)
Eosinophils Relative: 2 %
HCT: 44.1 % (ref 39.0–52.0)
Hemoglobin: 14.6 g/dL (ref 13.0–17.0)
Immature Granulocytes: 0 %
Lymphocytes Relative: 40 %
Lymphs Abs: 2.3 10*3/uL (ref 0.7–4.0)
MCH: 30.8 pg (ref 26.0–34.0)
MCHC: 33.1 g/dL (ref 30.0–36.0)
MCV: 93 fL (ref 80.0–100.0)
Monocytes Absolute: 0.5 10*3/uL (ref 0.1–1.0)
Monocytes Relative: 10 %
Neutro Abs: 2.7 10*3/uL (ref 1.7–7.7)
Neutrophils Relative %: 47 %
Platelets: 208 10*3/uL (ref 150–400)
RBC: 4.74 MIL/uL (ref 4.22–5.81)
RDW: 12.4 % (ref 11.5–15.5)
WBC: 5.6 10*3/uL (ref 4.0–10.5)
nRBC: 0 % (ref 0.0–0.2)

## 2021-04-25 LAB — BASIC METABOLIC PANEL
Anion gap: 6 (ref 5–15)
BUN: 12 mg/dL (ref 6–20)
CO2: 27 mmol/L (ref 22–32)
Calcium: 8.8 mg/dL — ABNORMAL LOW (ref 8.9–10.3)
Chloride: 105 mmol/L (ref 98–111)
Creatinine, Ser: 1.17 mg/dL (ref 0.61–1.24)
GFR, Estimated: >60 mL/min (ref >60)
Glucose, Bld: 94 mg/dL (ref 70–99)
Potassium: 4 mmol/L (ref 3.5–5.1)
Sodium: 138 mmol/L (ref 135–145)

## 2021-04-25 NOTE — Discharge Instructions (Addendum)
Please make sure before leaving home in the morning that you are drinking water and eating something. ?Please try to stop smoking. ?If you pass out again, your chest pain becomes constant, or you have any new or concerning symptoms please seek additional medical care and evaluation. ?If you are feeling poorly please make sure you are not driving, operating heavy machinery, or performing other potentially dangerous tasks. ? ?As we discussed today your heart rate is slightly low however I suspect that this is simply because you are young and healthy. ? ? ?

## 2021-04-25 NOTE — ED Provider Notes (Signed)
?David Stout ?Provider Note ? ? ?CSN: [id number] ?Arrival date & time: 04/25/21  1239 ? ?  ? ?History ? ?Chief Complaint  ?Patient presents with  ? Loss of Consciousness  ? ? ?David Stout is a 26 y.o. male with a past medical history of malignant bladder cancer in 2017 treated with transurethral resection who presents today for evaluation of syncope. ?He reports that yesterday he was at work early in the morning and had not eaten anything.  He started to feel lightheaded like he was going to pass out.  He states that many years ago when he was a teenager he would frequently have syncopal events.  He states no cause was ever found. ? ?He states that he started eating a cookie and then passed out for about 1 to 2 minutes.  He denies any injuries stating that he was wearing his hard hat when this happened. ? ?He states that yesterday he felt very tired and just generally unwell.  This morning when he woke up he felt similar however after eating he felt better. ? ?He reports that he constantly eats sweets and drinks sodas and sweet tea. ? ?He denies any drug use other than tobacco. ? ?He does report that over the past few days he has had about 2-3 episodes of chest pain.  These are only 1 to 2 seconds.  He denies any constant or lasting chest pain.  No shortness of breath. ? ?He denies any leg swelling.  No known seizure activity.  No fevers.  No nausea vomiting or diarrhea. ? ?Patient states that he is concerned he may be diabetic. ? ?HPI ? ?  ? ?Home Medications ?Prior to Admission medications   ?Medication Sig Start Date End Date Taking? Authorizing Provider  ?oxybutynin (DITROPAN) 5 MG tablet Take 1 tablet (5 mg total) by mouth every 8 (eight) hours as needed for bladder spasms. ?Patient not taking: Reported on 04/25/2021 03/08/15   Franchot Gallo, MD  ?traMADol (ULTRAM) 50 MG tablet Take 1 tablet (50 mg total) by mouth every 6 (six) hours as needed for moderate pain. ?Patient not taking:  Reported on 04/25/2021 03/08/15   Franchot Gallo, MD  ?   ? ?Allergies    ?Patient has no known allergies.   ? ?Review of Systems   ?Review of Systems ?See above ?Physical Exam ?Updated Vital Signs ?BP 102/64   Pulse (!) 52   Temp 98.2 ?F (36.8 ?C) (Oral)   Resp 14   Ht '5\' 7"'$  (1.702 m)   Wt 55.3 kg   SpO2 99%   BMI 19.11 kg/m?  ?Physical Exam ?Vitals and nursing note reviewed.  ?Constitutional:   ?   General: He is not in acute distress. ?   Appearance: He is not ill-appearing or diaphoretic.  ?HENT:  ?   Head: Normocephalic and atraumatic.  ?Eyes:  ?   General: No scleral icterus.    ?   Right eye: No discharge.     ?   Left eye: No discharge.  ?   Conjunctiva/sclera: Conjunctivae normal.  ?Cardiovascular:  ?   Rate and Rhythm: Normal rate and regular rhythm.  ?   Pulses: Normal pulses.  ?   Heart sounds: Normal heart sounds.  ?Pulmonary:  ?   Effort: Pulmonary effort is normal. No respiratory distress.  ?   Breath sounds: Normal breath sounds. No stridor.  ?Abdominal:  ?   General: There is no distension.  ?Musculoskeletal:     ?  General: No deformity.  ?   Cervical back: Normal range of motion and neck supple. No rigidity.  ?   Right lower leg: No edema.  ?   Left lower leg: No edema.  ?Skin: ?   General: Skin is warm and dry.  ?Neurological:  ?   Mental Status: He is alert.  ?   Motor: No abnormal muscle tone.  ?   Comments: Patient is awake and alert, answers questions appropriately.  Speech is not slurred.  Moves all 4 extremities spontaneously without difficulties.  Facial movements are symmetric.  Coordination is grossly intact.  ?Psychiatric:     ?   Behavior: Behavior normal.  ? ? ?ED Results / Procedures / Treatments   ?Labs ?(all labs ordered are listed, but only abnormal results are displayed) ?Labs Reviewed  ?BASIC METABOLIC PANEL - Abnormal; Notable for the following components:  ?    Result Value  ? Calcium 8.8 (*)   ? All other components within normal limits  ?URINALYSIS, ROUTINE W REFLEX  MICROSCOPIC - Abnormal; Notable for the following components:  ? Ketones, ur 5 (*)   ? Protein, ur 30 (*)   ? All other components within normal limits  ?CBC WITH DIFFERENTIAL/PLATELET  ? ? ?EKG ?EKG Interpretation ? ?Date/Time:  Wednesday April 25 2021 12:55:49 EDT ?Ventricular Rate:  62 ?PR Interval:  127 ?QRS Duration: 99 ?QT Interval:  385 ?QTC Calculation: 391 ?R Axis:   78 ?Text Interpretation: Sinus rhythm RSR' in V1 or V2, probably normal variant Confirmed by Noemi Chapel [phone]) on 04/25/2021 2:27:02 PM ? ?Radiology ?DG Chest 2 View ? ?Result Date: 04/25/2021 ?CLINICAL DATA:  Syncope, chest pain. EXAM: CHEST - 2 VIEW COMPARISON:  None. FINDINGS: EKG leads overlie the chest. The heart size and mediastinal contours are within normal limits. Perihilar bronchial wall cuffing. No focal airspace consolidation. No pleural effusion. No pneumothorax. The visualized skeletal structures are unremarkable. IMPRESSION: Perihilar bronchial wall cuffing, which may reflect bronchitis or reactive airways disease. No focal airspace consolidation. Electronically Signed   By: Dahlia Bailiff M.D.   On: 04/25/2021 13:54   ? ?Procedures ?Procedures  ? ? ?Medications Ordered in ED ?Medications - No data to display ? ?ED Course/ Medical Decision Making/ A&P ?Clinical Course as of 04/25/21 2357  ?Wed Apr 25, 2021  ?1303 EKG 12-Lead ?Sinus rhythm [EH]  ?1358 DG Chest 2 View ?Independently reviewed.  No consolidation, pneumothorax. [EH]  ?1359 CBC with Differential ?No anemia, no leukocytosis, no significant hematologic derangements. [EH]  ?1527 RBC / HPF: 0-5 ?No blood [EH]  ?  ?Clinical Course User Index ?[EH] Lorin Glass, PA-C  ? ?Orthostatic VS for the past 24 hrs: ? BP- Lying Pulse- Lying BP- Sitting Pulse- Sitting BP- Standing at 0 minutes Pulse- Standing at 0 minutes  ?04/25/21 1329 101/59 54 100/71 61 99/66 79  ? ? ? ? ?                        ?Medical Decision Making ?Patient is a 26 year old male who presents today  for evaluation of a syncopal event that occurred yesterday. ?He is having occasional 1 to 2 seconds of chest pain however nothing persistent and was not having chest pain or shortness of breath at the time of the syncopal event and clinically this sounds consistent with precordial catch syndrome, not ACS, PE, or other life-threatening cause of his symptoms. ?Labs and radiology obtained, please see below. ?EKG without significant arrhythmia.  He is bradycardic at points while in the emergency room however I suspect that this is physiologic rather than pathologic.  He does have elevation in his heart rate with orthostatic vital signs however does not have significant changes in his blood pressure and was asymptomatic. ? ?We discussed the importance of tobacco cessation, I spent over 5 minutes discussing this with patient. ? ?I suspect that patient's syncopal event was due to a combination of dehydration and possibly mild hypoglycemia.  He is currently feeling well without complaints. ? ? ? ?Return precautions were discussed with patient who states their understanding.  At the time of discharge patient denied any unaddressed complaints or concerns.  Patient is agreeable for discharge home. ? ?Note: Portions of this report may have been transcribed using voice recognition software. Every effort was made to ensure accuracy; however, inadvertent computerized transcription errors may be present ? ? ? ?Amount and/or Complexity of Data Reviewed ?External Data Reviewed: notes. ?   Details: Notes from his bladder cancer surgery ?Labs: ordered. Decision-making details documented in ED Course. ?   Details: CBC without anemia or leukocytosis.  BMP without significant derangements.  UA has 5 ketones, 30 protein however significant for no hematuria. ?Radiology: ordered. Decision-making details documented in ED Course. ?   Details: Chest x-ray with perihilar bronchial cuffing. ?I discussed this with patien and the importance of  tobacco cessation.  t ?ECG/medicine tests: ordered and independent interpretation performed. Decision-making details documented in ED Course. ? ?Risk ?Decision regarding hospitalization. ? ? ? ? ? ? ? ? ? ? ?Final Cli

## 2021-04-25 NOTE — ED Notes (Signed)
Pt given water and graham crackers for fluid/PO challenge. Pt tolerating well with no difficulties.  ?

## 2021-04-25 NOTE — ED Notes (Signed)
Pt reports yesterday morning around 0500 he started to feel bad and thought maybe his blood sugar was dropping (no hx of diabetes) so he ate 2 oatmeal cookies. Before he could finish the second one, he had a syncopal episode that lasted approximately 5 minutes. Pt reports the paramedics from his job got to him when he was waking up and told him his BP was "60 over something". After a few minutes it started to go up to "100  over something". Pt reports he went home afterwards because he still wasn't feeling normal. He didn't have a ride to the hospital so he wasn't able to get evaluated yesterday. Pt woke up this morning still not feeling completely well, so he felt he needed to come in to be evaluated. Pt reports hx of having syncopal episodes since a young child, several between the ages of [age], but only 2 since he was 70 years of ago, last one when he was 32. Pt reports he has always thought the syncopal episodes have come from low blood sugar, but has never actually been diagnosed with anything.  ?

## 2021-04-25 NOTE — ED Notes (Signed)
Pt reports he is unable to give urine sample at this time. Pt given water to drink.  ?

## 2021-04-25 NOTE — ED Triage Notes (Signed)
Patient complaining of syncopal episode the previous that has been ongoing for several years.  ?

## 2024-01-19 ENCOUNTER — Ambulatory Visit: Payer: Self-pay

## 2024-01-19 NOTE — Telephone Encounter (Signed)
 FYI Only or Action Required?: Action required by provider: update on patient condition.  Patient was last seen in primary care on .  Called Nurse Triage reporting Abdominal Pain.  Symptoms began several days ago.  Interventions attempted: Nothing.  Symptoms are: stable. Had vomiting Saturday and vomited a red substance that looks like plastic. No pain today, no PCP. Will go to UC.  Triage Disposition: See HCP Within 4 Hours (Or PCP Triage)  Patient/caregiver understands and will follow disposition?: Yes           Summary: stomach discomfort   Reason for Triage: The patient has called on the community line to discuss stomach discomfort and vomiting they experienced Saturday night. The patient shares that they have a history of concerns related to cancer and their stomach lining. The patient would like to ensure that they are okay. Please contact when possible            Reason for Disposition  [1] MILD-MODERATE pain AND [2] constant AND [3] present > 2 hours  Answer Assessment - Initial Assessment Questions 1. LOCATION: Where does it hurt?      No pain today 2. RADIATION: Does the pain shoot anywhere else? (e.g., chest, back)     no 3. ONSET: When did the pain begin? (Minutes, hours or days ago)      Had pain Saturday night - had vomiting x 1  4. SUDDEN: Gradual or sudden onset?     sudden 5. PATTERN Does the pain come and go, or is it constant?     Gone now 6. SEVERITY: How bad is the pain?  (e.g., Scale 1-10; mild, moderate, or severe)     No pain now 7. RECURRENT SYMPTOM: Have you ever had this type of stomach pain before? If Yes, ask: When was the last time? and What happened that time?      yes 8. CAUSE: What do you think is causing the stomach pain? (e.g., gallstones, recent abdominal surgery)     unsure 9. RELIEVING/AGGRAVATING FACTORS: What makes it better or worse? (e.g., antacids, bending or twisting motion, bowel movement)      N/a 10. OTHER SYMPTOMS: Do you have any other symptoms? (e.g., back pain, diarrhea, fever, urination pain, vomiting)       no  Protocols used: Abdominal Pain - Male-A-AH

## 2024-01-20 ENCOUNTER — Other Ambulatory Visit: Payer: Self-pay

## 2024-01-20 ENCOUNTER — Emergency Department (HOSPITAL_COMMUNITY)
Admission: EM | Admit: 2024-01-20 | Discharge: 2024-01-20 | Discharge status: Against Medical Advice | Payer: Self-pay | Attending: Emergency Medicine | Admitting: Emergency Medicine

## 2024-01-20 ENCOUNTER — Encounter (HOSPITAL_COMMUNITY): Payer: Self-pay

## 2024-01-20 DIAGNOSIS — R059 Cough, unspecified: Secondary | ICD-10-CM | POA: Insufficient documentation

## 2024-01-20 DIAGNOSIS — Z5321 Procedure and treatment not carried out due to patient leaving prior to being seen by health care provider: Secondary | ICD-10-CM | POA: Insufficient documentation

## 2024-01-20 HISTORY — DX: Malignant (primary) neoplasm, unspecified: C80.1

## 2024-01-20 NOTE — ED Triage Notes (Signed)
 Pt arrived via POV requesting evaluation for coughing up a foreign object this past Saturday. Pt presents pictures on his cellphone to this RN in Triage. Pt denies any pain at this time.
